# Patient Record
Sex: Male | Born: 1980 | Race: Black or African American | Hispanic: No | Marital: Married | Smoking: Never smoker
Health system: Southern US, Community
[De-identification: ages and names within clinical notes are randomized; demographics above are authoritative.]

## PROBLEM LIST (undated history)

## (undated) ENCOUNTER — Emergency Department (HOSPITAL_COMMUNITY): Admission: EM | Payer: Self-pay

## (undated) DIAGNOSIS — A6920 Lyme disease, unspecified: Secondary | ICD-10-CM

## (undated) HISTORY — PX: HERNIA REPAIR: SHX51

---

## 2008-09-06 ENCOUNTER — Ambulatory Visit: Payer: Self-pay | Admitting: Diagnostic Radiology

## 2008-09-06 ENCOUNTER — Emergency Department (HOSPITAL_BASED_OUTPATIENT_CLINIC_OR_DEPARTMENT_OTHER): Admission: EM | Admit: 2008-09-06 | Discharge: 2008-09-06 | Payer: Self-pay | Admitting: Emergency Medicine

## 2009-04-30 ENCOUNTER — Ambulatory Visit: Payer: Self-pay | Admitting: Diagnostic Radiology

## 2009-04-30 ENCOUNTER — Emergency Department (HOSPITAL_BASED_OUTPATIENT_CLINIC_OR_DEPARTMENT_OTHER): Admission: EM | Admit: 2009-04-30 | Discharge: 2009-04-30 | Payer: Self-pay | Admitting: Emergency Medicine

## 2010-08-01 ENCOUNTER — Emergency Department (HOSPITAL_BASED_OUTPATIENT_CLINIC_OR_DEPARTMENT_OTHER)
Admission: EM | Admit: 2010-08-01 | Discharge: 2010-08-01 | Disposition: A | Discharge status: Home / Self Care | Payer: Self-pay | Attending: Emergency Medicine | Admitting: Emergency Medicine

## 2010-08-01 ENCOUNTER — Emergency Department (INDEPENDENT_AMBULATORY_CARE_PROVIDER_SITE_OTHER): Payer: Self-pay

## 2010-08-01 DIAGNOSIS — E860 Dehydration: Secondary | ICD-10-CM | POA: Insufficient documentation

## 2010-08-01 DIAGNOSIS — R509 Fever, unspecified: Secondary | ICD-10-CM

## 2010-08-01 DIAGNOSIS — R112 Nausea with vomiting, unspecified: Secondary | ICD-10-CM

## 2010-08-01 LAB — DIFFERENTIAL
Basophils Relative: 0 % (ref 0–1)
Lymphs Abs: 3.7 10*3/uL (ref 0.7–4.0)
Monocytes Absolute: 0.5 10*3/uL (ref 0.1–1.0)
Neutro Abs: 1.5 10*3/uL — ABNORMAL LOW (ref 1.7–7.7)

## 2010-08-01 LAB — COMPREHENSIVE METABOLIC PANEL
ALT: 130 U/L — ABNORMAL HIGH (ref 0–53)
AST: 95 U/L — ABNORMAL HIGH (ref 0–37)
Albumin: 2.8 g/dL — ABNORMAL LOW (ref 3.5–5.2)
Alkaline Phosphatase: 97 U/L (ref 39–117)
CO2: 31 mEq/L (ref 19–32)
Creatinine, Ser: 1 mg/dL (ref 0.4–1.5)
GFR calc Af Amer: >60 mL/min (ref >60)
Glucose, Bld: 109 mg/dL — ABNORMAL HIGH (ref 70–99)
Potassium: 3.9 mEq/L (ref 3.5–5.1)
Sodium: 138 mEq/L (ref 135–145)

## 2010-08-01 LAB — CBC
HCT: 34.2 % — ABNORMAL LOW (ref 39.0–52.0)
Hemoglobin: 11.7 g/dL — ABNORMAL LOW (ref 13.0–17.0)
MCH: 28.1 pg (ref 26.0–34.0)
MCV: 82 fL (ref 78.0–100.0)
Platelets: 144 10*3/uL — ABNORMAL LOW (ref 150–400)

## 2010-08-01 LAB — URINALYSIS, ROUTINE W REFLEX MICROSCOPIC
Hgb urine dipstick: NEGATIVE
Nitrite: NEGATIVE
Protein, ur: 30 mg/dL — AB
Urobilinogen, UA: 4 mg/dL — ABNORMAL HIGH (ref 0.0–1.0)

## 2010-08-01 LAB — URINE MICROSCOPIC-ADD ON

## 2010-08-01 LAB — LIPASE, BLOOD: Lipase: 44 U/L (ref 11–59)

## 2012-07-01 ENCOUNTER — Encounter (HOSPITAL_BASED_OUTPATIENT_CLINIC_OR_DEPARTMENT_OTHER): Payer: Self-pay | Admitting: *Deleted

## 2012-07-01 ENCOUNTER — Emergency Department (HOSPITAL_BASED_OUTPATIENT_CLINIC_OR_DEPARTMENT_OTHER)
Admission: EM | Admit: 2012-07-01 | Discharge: 2012-07-02 | Disposition: A | Discharge status: Home / Self Care | Payer: Self-pay | Attending: Emergency Medicine | Admitting: Emergency Medicine

## 2012-07-01 DIAGNOSIS — R531 Weakness: Secondary | ICD-10-CM

## 2012-07-01 DIAGNOSIS — Z8619 Personal history of other infectious and parasitic diseases: Secondary | ICD-10-CM | POA: Insufficient documentation

## 2012-07-01 DIAGNOSIS — R5381 Other malaise: Secondary | ICD-10-CM | POA: Insufficient documentation

## 2012-07-01 DIAGNOSIS — R112 Nausea with vomiting, unspecified: Secondary | ICD-10-CM | POA: Insufficient documentation

## 2012-07-01 DIAGNOSIS — R5383 Other fatigue: Secondary | ICD-10-CM | POA: Insufficient documentation

## 2012-07-01 HISTORY — DX: Lyme disease, unspecified: A69.20

## 2012-07-01 MED ORDER — ONDANSETRON 8 MG PO TBDP: 8.0000 mg | ORAL_TABLET | Freq: Three times a day (TID) | ORAL | Status: DC | PRN

## 2012-07-01 NOTE — ED Provider Notes (Signed)
History    Scribed for Hanley Seamen, MD, the patient was seen in room MH06/MH06. This chart was scribed by Lewanda Rife, ED scribe. Patient's care was started at  2345   CSN: [id number]  Arrival date & time 07/01/12  2001   None     Chief Complaint  Patient presents with  . Generalized Body Aches    (Consider location/radiation/quality/duration/timing/severity/associated sxs/prior treatment) HPI HPI Comments: Taylor Davidson is a 32 y.o. male who presents to the Emergency Department complaining of constant mild generalized myalgias onset this morning. Reports decreased appetite, nausea, weakness, and vomiting. Reports mild arthralgias onset 3 days. Denies fever, significant pain, chest pain, shortness of breath, and diarrhea. Denies any aggravating or alleviating factors. Denies taking any medications prior to arrival to treat symptoms. Reports symptoms are similar, but less severe than Lyme's disease dx in 2012. Hx of dx with Lyme's disease 2012 and hospitalized 2-3 weeks and abx given for 2 weeks post admission in [place].   Past Medical History  Diagnosis Date  . Lyme disease     History reviewed. No pertinent past surgical history.  History reviewed. No pertinent family history.  History  Substance Use Topics  . Smoking status: Never Smoker   . Smokeless tobacco: Not on file  . Alcohol Use: No      Review of Systems  Constitutional: Negative for fever.  Gastrointestinal: Positive for nausea and vomiting.  Musculoskeletal: Positive for myalgias (generalized ).  All other systems reviewed and are negative.   A complete 10 system review of systems was obtained and all systems are negative except as noted in the HPI and PMH.    Allergies  Review of patient's allergies indicates no known allergies.  Home Medications  No current outpatient prescriptions on file.  BP 127/84  Pulse 82  Temp(Src) 97.9 F (36.6 C) (Oral)  Resp 18  Ht 6\' 2"  (1.88 m)  Wt  198 lb (89.812 kg)  BMI 25.41 kg/m2  SpO2 99%  Physical Exam  Nursing note and vitals reviewed. Constitutional: He is oriented to person, place, and time. He appears well-developed and well-nourished. No distress.  HENT:  Head: Normocephalic and atraumatic.  Eyes: EOM are normal.  Neck: Neck supple. No tracheal deviation present.  Cardiovascular: Normal rate.   Pulmonary/Chest: Effort normal. No respiratory distress.  Musculoskeletal: Normal range of motion.  Neurological: He is alert and oriented to person, place, and time.  Skin: Skin is warm and dry.  Psychiatric: He has a normal mood and affect. His behavior is normal.    ED Course  Procedures (including critical care time)   MDM  Will order Lyme titers and referred [facility] the [facility].      I personally performed the services described in this documentation, which was scribed in my presence.  The recorded information has been reviewed and is accurate.   Carlisle Beers Jefferson Fullam, MD 07/02/12 0001
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
## 2012-07-04 LAB — B. BURGDORFI ANTIBODIES BY WB: B burgdorferi IgM Abs (IB): NEGATIVE

## 2013-01-12 ENCOUNTER — Emergency Department (HOSPITAL_BASED_OUTPATIENT_CLINIC_OR_DEPARTMENT_OTHER)
Admission: EM | Admit: 2013-01-12 | Discharge: 2013-01-13 | Disposition: A | Discharge status: Home / Self Care | Payer: Self-pay | Attending: Emergency Medicine | Admitting: Emergency Medicine

## 2013-01-12 ENCOUNTER — Encounter (HOSPITAL_BASED_OUTPATIENT_CLINIC_OR_DEPARTMENT_OTHER): Payer: Self-pay | Admitting: Emergency Medicine

## 2013-01-12 DIAGNOSIS — S61452A Open bite of left hand, initial encounter: Secondary | ICD-10-CM

## 2013-01-12 DIAGNOSIS — Y939 Activity, unspecified: Secondary | ICD-10-CM | POA: Insufficient documentation

## 2013-01-12 DIAGNOSIS — Y929 Unspecified place or not applicable: Secondary | ICD-10-CM | POA: Insufficient documentation

## 2013-01-12 DIAGNOSIS — Z23 Encounter for immunization: Secondary | ICD-10-CM | POA: Insufficient documentation

## 2013-01-12 DIAGNOSIS — W540XXA Bitten by dog, initial encounter: Secondary | ICD-10-CM | POA: Insufficient documentation

## 2013-01-12 DIAGNOSIS — Z8619 Personal history of other infectious and parasitic diseases: Secondary | ICD-10-CM | POA: Insufficient documentation

## 2013-01-12 DIAGNOSIS — S61409A Unspecified open wound of unspecified hand, initial encounter: Secondary | ICD-10-CM | POA: Insufficient documentation

## 2013-01-12 MED ORDER — AMOXICILLIN-POT CLAVULANATE 875-125 MG PO TABS
1.0000 | ORAL_TABLET | Freq: Once | ORAL | Status: AC
  Administered 2013-01-12: 1 via ORAL
  Filled 2013-01-12: qty 1

## 2013-01-12 MED ORDER — IBUPROFEN 800 MG PO TABS
ORAL_TABLET | ORAL | Status: AC
  Filled 2013-01-12: qty 1

## 2013-01-12 MED ORDER — AMOXICILLIN-POT CLAVULANATE 875-125 MG PO TABS: 1.0000 | ORAL_TABLET | Freq: Two times a day (BID) | ORAL | Status: DC

## 2013-01-12 MED ORDER — IBUPROFEN 800 MG PO TABS
800.0000 mg | ORAL_TABLET | Freq: Once | ORAL | Status: AC
  Administered 2013-01-12: 800 mg via ORAL

## 2013-01-12 MED ORDER — HYDROCODONE-ACETAMINOPHEN 5-325 MG PO TABS: 1.0000 | ORAL_TABLET | ORAL | Status: DC | PRN

## 2013-01-12 MED ORDER — TETANUS-DIPHTH-ACELL PERTUSSIS 5-2.5-18.5 LF-MCG/0.5 IM SUSP
0.5000 mL | Freq: Once | INTRAMUSCULAR | Status: AC
  Administered 2013-01-12: 0.5 mL via INTRAMUSCULAR
  Filled 2013-01-12: qty 0.5

## 2013-01-12 NOTE — ED Notes (Signed)
Pt reports was bitten by a friends dog one day ago know owner of dog and states dog is up to date with shots

## 2013-01-12 NOTE — ED Provider Notes (Signed)
Medical screening examination/treatment/procedure(s) were performed by non-physician practitioner and as supervising physician I was immediately available for consultation/collaboration.  EKG Interpretation   None        Senna Lape K Trino Higinbotham-Rasch, MD 01/12/13 2350

## 2013-01-12 NOTE — ED Provider Notes (Signed)
CSN: [id number]     Arrival date & time 01/12/13  2247 History   First MD Initiated Contact with Patient 01/12/13 2306     Chief Complaint  Patient presents with  . Animal Bite   (Consider location/radiation/quality/duration/timing/severity/associated sxs/prior Treatment) Patient is a 32 y.o. male presenting with animal bite. The history is provided by the patient. No language interpreter was used.  Animal Bite Contact animal:  Dog Pain details:    Quality:  Aching Associated symptoms: no fever   Associated symptoms comment:  Dog mite to left hand yesterday by a dog that belonged to a neighbor. He presents tonight because he reports seeing pus in one of the puncture wounds. No fever or significant swelling.   Past Medical History  Diagnosis Date  . Lyme disease    History reviewed. No pertinent past surgical history. History reviewed. No pertinent family history. History  Substance Use Topics  . Smoking status: Never Smoker   . Smokeless tobacco: Not on file  . Alcohol Use: No    Review of Systems  Constitutional: Negative for fever.  Musculoskeletal:       See HPI.  Skin:       See HPI.    Allergies  Review of patient's allergies indicates no known allergies.  Home Medications   Current Outpatient Rx  Name  Route  Sig  Dispense  Refill  . ondansetron (ZOFRAN ODT) 8 MG disintegrating tablet   Oral   Take 1 tablet (8 mg total) by mouth every 8 (eight) hours as needed.   10 tablet   0    BP 130/76  Pulse 77  Temp(Src) 97.6 F (36.4 C) (Oral)  Resp 16  Ht 6\' 2"  (1.88 m)  Wt 205 lb (92.987 kg)  BMI 26.31 kg/m2  SpO2 100% Physical Exam  Constitutional: He is oriented to person, place, and time. He appears well-developed and well-nourished. No distress.  Neurological: He is alert and oriented to person, place, and time.  Skin: Skin is warm and dry.  Puncture wound to left dorsum of hand over 2nd MC; second wound at web space between 1st and 2nd digits;  small ulceration to dorsum of 2nd digit over proximal phalanx. No swelling of hand. No redness. Unable to express any pus from either wound. FROM all joints of hand and wrist.   Psychiatric: He has a normal mood and affect.    ED Course  Procedures (including critical care time) Labs Review Labs Reviewed - No data to display Imaging Review No results found.  EKG Interpretation   None       MDM  No diagnosis found. 1. Animal bite, left hand 2. Multiple puncture wounds, left hand  No evidence infection - no redness or visualized pus. Tetanus updated, pain managed. Abx started. Refer to hand for recheck if there are signs of infection.    Arnoldo Hooker, PA-C 01/12/13 2330

## 2013-01-13 NOTE — ED Notes (Signed)
Pt will call back with more information about the dog later this evening.  Will call Licensed conveyancer.

## 2013-01-16 NOTE — ED Notes (Signed)
Received call from [facility], s/w [place].  Will need to fax Animal Bite Report to [place], [place] [employer].

## 2013-03-01 ENCOUNTER — Encounter (HOSPITAL_BASED_OUTPATIENT_CLINIC_OR_DEPARTMENT_OTHER): Payer: Self-pay | Admitting: Emergency Medicine

## 2013-03-01 ENCOUNTER — Emergency Department (HOSPITAL_BASED_OUTPATIENT_CLINIC_OR_DEPARTMENT_OTHER): Payer: Self-pay

## 2013-03-01 ENCOUNTER — Emergency Department (HOSPITAL_BASED_OUTPATIENT_CLINIC_OR_DEPARTMENT_OTHER)
Admission: EM | Admit: 2013-03-01 | Discharge: 2013-03-01 | Disposition: A | Discharge status: Home / Self Care | Payer: Self-pay | Attending: Emergency Medicine | Admitting: Emergency Medicine

## 2013-03-01 DIAGNOSIS — R51 Headache: Secondary | ICD-10-CM | POA: Insufficient documentation

## 2013-03-01 DIAGNOSIS — M791 Myalgia, unspecified site: Secondary | ICD-10-CM

## 2013-03-01 DIAGNOSIS — R5383 Other fatigue: Secondary | ICD-10-CM

## 2013-03-01 DIAGNOSIS — J3489 Other specified disorders of nose and nasal sinuses: Secondary | ICD-10-CM | POA: Insufficient documentation

## 2013-03-01 DIAGNOSIS — R519 Headache, unspecified: Secondary | ICD-10-CM

## 2013-03-01 DIAGNOSIS — IMO0001 Reserved for inherently not codable concepts without codable children: Secondary | ICD-10-CM | POA: Insufficient documentation

## 2013-03-01 DIAGNOSIS — N289 Disorder of kidney and ureter, unspecified: Secondary | ICD-10-CM | POA: Insufficient documentation

## 2013-03-01 DIAGNOSIS — R5381 Other malaise: Secondary | ICD-10-CM | POA: Insufficient documentation

## 2013-03-01 DIAGNOSIS — H5710 Ocular pain, unspecified eye: Secondary | ICD-10-CM | POA: Insufficient documentation

## 2013-03-01 LAB — BASIC METABOLIC PANEL
BUN: 17 mg/dL (ref 6–23)
CO2: 31 mEq/L (ref 19–32)
Calcium: 9.6 mg/dL (ref 8.4–10.5)
Chloride: 101 mEq/L (ref 96–112)
Creatinine, Ser: 1.4 mg/dL — ABNORMAL HIGH (ref 0.50–1.35)
GFR, EST AFRICAN AMERICAN: 76 mL/min — AB (ref >90)
GFR, EST NON AFRICAN AMERICAN: 66 mL/min — AB (ref >90)
GLUCOSE: 88 mg/dL (ref 70–99)
Potassium: 3.8 mEq/L (ref 3.7–5.3)
Sodium: 141 mEq/L (ref 137–147)

## 2013-03-01 LAB — CBC
HEMATOCRIT: 40.8 % (ref 39.0–52.0)
Hemoglobin: 14 g/dL (ref 13.0–17.0)
MCH: 29.4 pg (ref 26.0–34.0)
MCHC: 34.3 g/dL (ref 30.0–36.0)
MCV: 85.5 fL (ref 78.0–100.0)
PLATELETS: 204 10*3/uL (ref 150–400)
RBC: 4.77 MIL/uL (ref 4.22–5.81)
RDW: 13 % (ref 11.5–15.5)
WBC: 7.1 10*3/uL (ref 4.0–10.5)

## 2013-03-01 LAB — CK: Total CK: 131 U/L (ref 7–232)

## 2013-03-01 MED ORDER — ONDANSETRON HCL 4 MG/2ML IJ SOLN: 4.0000 mg | Freq: Once | INTRAMUSCULAR | Status: DC

## 2013-03-01 MED ORDER — SODIUM CHLORIDE 0.9 % IV BOLUS (SEPSIS)
1000.0000 mL | Freq: Once | INTRAVENOUS | Status: AC
  Administered 2013-03-01: 1000 mL via INTRAVENOUS

## 2013-03-01 MED ORDER — METOCLOPRAMIDE HCL 5 MG/ML IJ SOLN
10.0000 mg | Freq: Once | INTRAMUSCULAR | Status: AC
  Administered 2013-03-01: 10 mg via INTRAVENOUS
  Filled 2013-03-01: qty 2

## 2013-03-01 MED ORDER — NAPROXEN 500 MG PO TABS: 500.0000 mg | ORAL_TABLET | Freq: Two times a day (BID) | ORAL | Status: DC

## 2013-03-01 MED ORDER — KETOROLAC TROMETHAMINE 30 MG/ML IJ SOLN
30.0000 mg | Freq: Once | INTRAMUSCULAR | Status: AC
  Administered 2013-03-01: 30 mg via INTRAVENOUS
  Filled 2013-03-01: qty 1

## 2013-03-01 NOTE — Discharge Instructions (Signed)
Your testing has shown that you have a slight change in your kidney function - this must be rechecked in the next 2 weeks to make sure that it is improving.   Please call your doctor for a followup appointment within 24-48 hours. When you talk to your doctor please let them know that you were seen in the emergency department and have them acquire all of your records so that they can discuss the findings with you and formulate a treatment plan to fully care for your new and ongoing problems.   Emergency Department Resource Guide 1) Find a Doctor and Pay Out of Pocket Although you won't have to find out who is covered by your insurance plan, it is a good idea to ask around and get recommendations. You will then need to call the office and see if the doctor you have chosen will accept you as a new patient and what types of options they offer for patients who are self-pay. Some doctors offer discounts or will set up payment plans for their patients who do not have insurance, but you will need to ask so you aren't surprised when you get to your appointment.  2) Contact Your Local Health Department Not all health departments have doctors that can see patients for sick visits, but many do, so it is worth a call to see if yours does. If you don't know where your local health department is, you can check in your phone book. The CDC also has a tool to help you locate your state's health department, and many state websites also have listings of all of their local health departments.  3) Find a Walk-in Clinic If your illness is not likely to be very severe or complicated, you may want to try a walk in clinic. These are popping up all over the country in pharmacies, drugstores, and shopping centers. They're usually staffed by nurse practitioners or physician assistants that have been trained to treat common illnesses and complaints. They're usually fairly quick and inexpensive. However, if you have serious medical  issues or chronic medical problems, these are probably not your best option.  No Primary Care Doctor: - Call Health Connect at  [phone] - they can help you locate a primary care doctor that  accepts your insurance, provides certain services, etc. - Physician Referral Service- [phone]  Chronic Pain Problems: Organization         Address  Phone   Notes  [facility]  [phone] Patients need to be referred by their primary care doctor.   Medication Assistance: Organization         Address  Phone   Notes  [facility] Medication [facility] [address] [place], [place] [address] [phone] --Must be a resident of [place] [facility] -- Must have NO insurance coverage whatsoever (no Medicaid/ Medicare, etc.) -- The pt. MUST have a primary care doctor that directs their care regularly and follows them in the community   MedAssist  [phone]   [employer]  [phone]    Agencies that provide inexpensive medical care: Organization         Address  Phone   Notes  [facility]  [phone]   [facility]    [phone]   [facility] [address] [place], [place] [address] [phone]   [facility] [address], [place] [phone]  Planned Parenthood    [phone]   [facility]    [phone]   Community Health and [place] [address], [place] Phone:  [phone], Fax:  [phone] Hours of Operation:  9 am - 6 pm, M-F.  Also accepts Medicaid/Medicare and self-pay.  [facility] for [address] [address], [address], [place] Phone: [phone], Fax: [phone]. Hours of Operation:  8:30 am - 5:30 pm, M-F.  Also accepts Medicaid and self-pay.  [facility] [address], [place] Phone: [phone]   [place], [place], [place]  [phone], Ext. [phone] Mondays & Thursdays: 7-9 AM.  First 15 patients are seen on a first come, first serve basis.    [place] Providers:  Organization         Address  Phone   Notes  [facility] [address], [place] [phone] Also accepts self-pay patients.  [facility] [address], [place]  [phone]   [place], [address], [place] [phone]   [facility] [address], [place] [phone]   Lucianne Lei [address], [place]   [phone] Only accepts [place] Access [place] patients after they have their name applied to their card.   Self-Pay (no insurance) in [facility]:  Organization         Address  Phone   Notes  Sickle Cell Patients, [facility] [place] [phone]   [facility] [place] [phone]   [facility] [place]  [place], [place], [place] [phone]   [facility]/Dr. Osei-Bonsu  [address] [place] or [address], [place] [phone] Phone number for both [place] and [place] locations is the same.  [facility] [address] [place] [phone]   [facility] [address] [place] or [address] [phone]   [facility] [address] [place] [phone], phone; [phone], fax Sees patients 1st and 3rd Saturday of every month.  Must not qualify for public or private insurance (i.e. Medicaid, Medicare, [place] Health Choice, Veterans' Benefits)  Household income should be no more than 200% of the poverty level The clinic cannot treat you if you are pregnant or think you are pregnant  Sexually transmitted  diseases are not treated at the clinic.    Dental Care: Organization         Address  Phone  Notes  [facility] of [facility] [place] [phone] Accepts children up to age 25 who are enrolled in [place] or [place]; pregnant women with a Medicaid card; and children who have applied for Medicaid or [place] Health Choice, but were declined, whose parents can pay a reduced fee at time of service.  [facility] Department of [facility]  [address], [place] [phone] Accepts children up to age 96 who are enrolled in [place] or [place]; pregnant women with a Medicaid card; and children who have applied for Medicaid or [place], but were declined,  whose parents can pay a reduced fee at time of service.  [facility]  [place] [phone] Patients are seen by appointment only. Walk-ins are not accepted. [place] will see patients 2 years of age and older. Monday - Tuesday (8am-5pm) Most Wednesdays (8:30-5pm) $30 per visit, cash only  [facility] Adult [facility] Access PRO[facility]  [address], [facility] [phone] Patients are seen by appointment only. Walk-ins are not accepted. [place] will see patients 41 years of age and older. One Wednesday Evening (Monthly: Volunteer Based).  $30 per visit, cash only  [place]  [phone] for adults; Children under age 26, call Graduate Pediatric Dentistry at [phone]. Children aged 20-14, please call [phone] to request a pediatric application.  Dental services are provided in all areas of dental care including fillings, crowns and bridges, complete and partial dentures, implants, gum treatment, root canals, and extractions. Preventive care is also provided. Treatment is provided to both adults and children. Patients are selected via a  lottery and there is often a waiting list.   [facility] [address] [place]  [phone] [email or web address]   [facility] [address] [place], [place] [phone], Ext. [phone] Second and Fourth Thursday of each month, opens at 6:30 AM; Clinic ends at 9 AM.  Patients are seen on a first-come first-served basis, and a limited number are seen during each clinic.   [facility]  [address] Hillard Danker [place], [place] [phone]   Eligibility Requirements You must have lived in [place], [place], or [place] counties for at least the last three months.   You cannot be eligible for state or federal sponsored [employer], including [employer], [place], or [employer].   You generally cannot be eligible for healthcare insurance through your employer.    How to apply: Eligibility screenings are held every Tuesday and Wednesday afternoon from 1:00 pm until 4:00 pm. You do not need an appointment for the interview!  [facility] [address] [place], [place]   [place]  [place] Department  [place]  [phone]    Behavioral Health Resources in the Community: Intensive Outpatient Programs Organization         Address  Phone  Notes  [facility] [place]. [address] [place], [place] [phone]   [facility] [address] [place], [place]   ADS: Alcohol & Drug Svcs [address], [place], [place]   [place] [address]  [place], [place] or [phone]   Substance Abuse Resources Organization         Address  Phone  Notes  Alcohol and Drug Services  [phone]   [place]  [phone]   [facility] [place]   Chinita Pester  [phone]   Residential &  Outpatient Substance Abuse Program  [phone]   Psychological Services Organization         Address  Phone  Notes  [facility]  [place]  [phone]   [place] [address], [place] or [phone]    Mobile Crisis Teams Organization         Address  Phone  Notes  Therapeutic Alternatives, Mobile  Crisis Care Unit  [phone]   Assertive Psychotherapeutic Services  [address] [place], [place]   [facility] [address] [place] [phone]    Self-Help/Support Groups Organization         Address  Phone             Notes  [place]. of [place] - variety of support groups  [place] Call for more information  Narcotics Anonymous (NA), [facility] [address], [employer] [place]  2 meetings at this location   Special educational needs teacher         Address  Phone  Notes  ASAP Residential Treatment [place],    [place]  [phone]   [facility]  [address] [place] [address]056, [place], [place]   [place], [place] [phone] Admissions: 8am-3pm M-F  Incentives Substance [place] [address],    [place], [place] [phone]   [facility] [address] [place], [place], [place]   [facility] [address],  [place], [place]   [facility]ight Programs - Intensive Outpatient [address], [place], [place]   [facility] [place] ([place] [address]  [place], [place] [phone] or [phone]   [facility] (RTS) [address], [place], [place] Accepts Medicaid  [facility] [place] [address],  [place] [phone] Substance Abuse/Addiction Treatment   [facility] Organization          Address  Phone  Notes  [facility]  [phone]   Domenic Schwab, PhD [address] Arlis Porta [place], [place]   [phone] or [phone]   [place] Br[address] [place], [place] [phone]   [facility] [address] [place] [employer], [place] [phone] Insurance/Medicaid/sponsorship through [facility] and Families [address] [place]                                    [place], [place] [phone] [place] [address], [place] [phone]    Dr. Adele Schilder  [phone]   [facility] Dept. 1) [address], [place] 2) [place] 3)  [place] [address], [place] [phone]  [phone]   [place] [phone] or [phone] (After Hours)

## 2013-03-01 NOTE — ED Provider Notes (Signed)
CSN: [id number]     Arrival date & time 03/01/13  0345 History   First MD Initiated Contact with Patient 03/01/13 0357     Chief Complaint  Patient presents with  . Headache   (Consider location/radiation/quality/duration/timing/severity/associated sxs/prior Treatment) HPI Comments: 33 y/o male with hx of Lyme disease in 2012 (treated without sequele per pt), also hx of dog bite 6 weeks ago (inside dog - dachshund), he was treated for the dog bite with tetanus and abx - couldn't find the dog after he was d/c'd - no f/u - no rabies treatment.  He states this was a friend's dog - it was an older dog that he was petting when it bit him.    He has done fine for the last month until 4 days ago when he developed a headache - diffuse aching and constant - has lasted for 4 days - not worsening, not assocaited with n/f/v/c/cough or diarrhea.  No rashes, no sob, no cp, no abd pain and normal appetitie.  He has had no numbness, no weakness, no change in vision.  He has some myalgias of his lower back and legs and has trouble standing b/c of pain and generalized weakness.  He has some pain in his neck and some pain with closing his eyes.  He denies stiffness of the neck or lymphadenopathy of the neck.  Denies other PMH and dnies sick contacts.  Family member with pt states he has been his normal self other than the headache.  Patient is a 33 y.o. male presenting with headaches. The history is provided by the patient and a relative.  Headache Associated symptoms: congestion ( chronic nasal congestion and post nasal drip), eye pain, fatigue and myalgias   Associated symptoms: no abdominal pain, no cough, no diarrhea, no dizziness, no ear pain, no fever, no nausea, no neck stiffness, no numbness, no photophobia, no seizures, no sore throat and no vomiting     Past Medical History  Diagnosis Date  . Lyme disease    History reviewed. No pertinent past surgical history. No family history on file. History   Substance Use Topics  . Smoking status: Never Smoker   . Smokeless tobacco: Not on file  . Alcohol Use: No    Review of Systems  Constitutional: Positive for fatigue. Negative for fever and chills.  HENT: Positive for congestion ( chronic nasal congestion and post nasal drip). Negative for ear pain, facial swelling, rhinorrhea and sore throat.   Eyes: Positive for pain. Negative for photophobia and visual disturbance.  Respiratory: Negative for cough, chest tightness and shortness of breath.   Cardiovascular: Negative for chest pain and leg swelling.  Gastrointestinal: Negative for nausea, vomiting, abdominal pain and diarrhea.  Endocrine: Negative for polydipsia and polyuria.  Genitourinary: Negative for hematuria.  Musculoskeletal: Positive for myalgias. Negative for neck stiffness.  Skin: Negative for rash.  Neurological: Positive for headaches. Negative for dizziness, seizures, speech difficulty and numbness.  Hematological: Negative for adenopathy.  Psychiatric/Behavioral: Negative for hallucinations, behavioral problems, confusion and decreased concentration.  All other systems reviewed and are negative.    Allergies  Review of patient's allergies indicates no known allergies.  Home Medications   Current Outpatient Rx  Name  Route  Sig  Dispense  Refill  . amoxicillin-clavulanate (AUGMENTIN) 875-125 MG per tablet   Oral   Take 1 tablet by mouth every 12 (twelve) hours.   20 tablet   0   . HYDROcodone-acetaminophen (NORCO/VICODIN) 5-325 MG per tablet  Oral   Take 1 tablet by mouth every 4 (four) hours as needed.   6 tablet   0   . naproxen (NAPROSYN) 500 MG tablet   Oral   Take 1 tablet (500 mg total) by mouth 2 (two) times daily with a meal.   30 tablet   0   . ondansetron (ZOFRAN ODT) 8 MG disintegrating tablet   Oral   Take 1 tablet (8 mg total) by mouth every 8 (eight) hours as needed.   10 tablet   0    BP 120/68  Pulse 80  Temp(Src) 97.9 F  (36.6 C) (Oral)  Resp 18  Ht 6\' 2"  (1.88 m)  Wt 210 lb (95.255 kg)  BMI 26.95 kg/m2  SpO2 100% Physical Exam  Nursing note and vitals reviewed. Constitutional: He appears well-developed and well-nourished. No distress.  HENT:  Head: Normocephalic and atraumatic.  Mouth/Throat: Oropharynx is clear and moist. No oropharyngeal exudate.  Nontender sinuses, clear oropharynx, clear nasal passages  Eyes: Conjunctivae and EOM are normal. Pupils are equal, round, and reactive to light. Right eye exhibits no discharge. Left eye exhibits no discharge. No scleral icterus.  Neck: Normal range of motion. Neck supple. No JVD present. No thyromegaly present.  Very supple nontender neck with no lymphadenopathy  Cardiovascular: Normal rate, regular rhythm, normal heart sounds and intact distal pulses.  Exam reveals no gallop and no friction rub.   No murmur heard. Pulmonary/Chest: Effort normal and breath sounds normal. No respiratory distress. He has no wheezes. He has no rales.  Abdominal: Soft. Bowel sounds are normal. He exhibits no distension and no mass. There is no tenderness.  Musculoskeletal: Normal range of motion. He exhibits no edema and no tenderness.  Lymphadenopathy:    He has no cervical adenopathy.  Neurological: He is alert. Coordination normal.  Neurologic exam:  Speech clear, pupils equal round reactive to light, extraocular movements intact  Normal peripheral visual fields Cranial nerves III through XII normal including no facial droop Follows commands, moves all extremities x4, normal strength to bilateral upper and lower extremities at all major muscle groups including grip Sensation normal to light touch and pinprick Coordination intact, no limb ataxia, finger-nose-finger normal Rapid alternating movements normal No pronator drift Gait normal   Skin: Skin is warm and dry. No rash noted. No erythema.  Psychiatric: He has a normal mood and affect. His behavior is normal.     ED Course  Procedures (including critical care time) Labs Review Labs Reviewed  BASIC METABOLIC PANEL - Abnormal; Notable for the following:    Creatinine, Ser 1.40 (*)    GFR calc non Af Amer 66 (*)    GFR calc Af Amer 76 (*)    All other components within normal limits  CBC  CK   Imaging Review Ct Head Wo Contrast  03/01/2013   CLINICAL DATA:  Headaches  EXAM: CT HEAD WITHOUT CONTRAST  TECHNIQUE: Contiguous axial images were obtained from the base of the skull through the vertex without intravenous contrast.  COMPARISON:  None.  FINDINGS: There is no acute intracranial hemorrhage or infarct. No mass lesion or midline shift. Gray-white matter differentiation is well maintained. Ventricles are normal in size without evidence of hydrocephalus. CSF containing spaces are within normal limits. No extra-axial fluid collection.  The calvarium is intact.  Orbital soft tissues are within normal limits.  The paranasal sinuses and mastoid air cells are well pneumatized and free of fluid.  Scalp soft tissues are unremarkable.  IMPRESSION: Normal head CT with no acute intracranial process.   Electronically Signed   By: Rise MuBenjamin  McClintock M.D.   On: 03/01/2013 05:39    EKG Interpretation   None       MDM   1. Headache   2. Myalgia   3. Fatigue   4. Renal insufficiency    The patient's exam is very benign, mental status is normal and neurologic exam is normal. There does not appear to be any significant infections that would be causing his symptoms, his symptoms are also not consistent with a gradually progressive neurologic disorder such as rabies. He has no pain at the inoculation site, has no neurologic symptoms, no encephalopathy, no confusion and has headache without any other significant symptoms. Vital signs are normal, treated with medications, fluids, basic labs to explore other sources including CK to rule out rhabdo.  4:50 AM - pt reexamined - has ongoing headache without  improvement after fluid and meds - he has improved myalgias - check head CT as he does not have frequent ha and this has been 4 days of posterior ha without fluctuation or improvement.  0545 AM - pt is ambulating without difficulty - has clear speech, normal coordination and states that ha and myalgias are better - review of MR shows that he had neg Lyme testing in May of 2014 when he presented to ED for myalgias at that time.    0550 AM - paged ID - d/w Dr. Ninetta LightsHatcher - recommends giving RIG and vacc series and referral for brain biopsy if unable to establish if dog is still alive.  0600 - d/w pt again - now he is adamant that the dog is still alive and he is "sure" that he has seen in in the last few days.  I asked repeatedly and he staunchly believes this to be true.  If that is the case - then this is not Rabies and no indication for further tx.  Pt inofrmed of results including Cr and indication for f/u - resource list given.  Meds given in ED:  Medications  ketorolac (TORADOL) 30 MG/ML injection 30 mg (30 mg Intravenous Given 03/01/13 0422)  metoCLOPramide (REGLAN) injection 10 mg (10 mg Intravenous Given 03/01/13 0422)  sodium chloride 0.9 % bolus 1,000 mL (0 mLs Intravenous Stopped 03/01/13 0500)    New Prescriptions   NAPROXEN (NAPROSYN) 500 MG TABLET    Take 1 tablet (500 mg total) by mouth 2 (two) times daily with a meal.      Vida RollerBrian D Rabon Scholle, MD 03/01/13 [phone]

## 2013-03-01 NOTE — ED Notes (Signed)
Pt complains of headache x 3 days.  Pt also complains of generalized bodyache and nasal congestion.  Pt has hx of lyme disease.  Pt complains of kidney pain.

## 2014-03-04 DIAGNOSIS — Z8619 Personal history of other infectious and parasitic diseases: Secondary | ICD-10-CM | POA: Insufficient documentation

## 2014-03-04 DIAGNOSIS — Z791 Long term (current) use of non-steroidal anti-inflammatories (NSAID): Secondary | ICD-10-CM | POA: Insufficient documentation

## 2014-03-04 DIAGNOSIS — Z79899 Other long term (current) drug therapy: Secondary | ICD-10-CM | POA: Insufficient documentation

## 2014-03-04 DIAGNOSIS — J392 Other diseases of pharynx: Secondary | ICD-10-CM | POA: Insufficient documentation

## 2014-03-04 DIAGNOSIS — Z792 Long term (current) use of antibiotics: Secondary | ICD-10-CM | POA: Insufficient documentation

## 2014-03-05 ENCOUNTER — Encounter (HOSPITAL_COMMUNITY): Payer: Self-pay | Admitting: Emergency Medicine

## 2014-03-05 ENCOUNTER — Emergency Department (HOSPITAL_COMMUNITY)
Admission: EM | Admit: 2014-03-05 | Discharge: 2014-03-05 | Disposition: A | Discharge status: Home / Self Care | Payer: Self-pay | Attending: Emergency Medicine | Admitting: Emergency Medicine

## 2014-03-05 DIAGNOSIS — R6889 Other general symptoms and signs: Secondary | ICD-10-CM

## 2014-03-05 DIAGNOSIS — R0989 Other specified symptoms and signs involving the circulatory and respiratory systems: Secondary | ICD-10-CM

## 2014-03-05 MED ORDER — RANITIDINE HCL 150 MG PO TABS: 150.0000 mg | ORAL_TABLET | Freq: Two times a day (BID) | ORAL | Status: DC

## 2014-03-05 MED ORDER — CETIRIZINE HCL 10 MG PO TABS: 10.0000 mg | ORAL_TABLET | Freq: Every day | ORAL | Status: DC

## 2014-03-05 NOTE — ED Provider Notes (Signed)
CSN: [id number]     Arrival date & time 03/04/14  2343 History   First MD Initiated Contact with Patient 03/05/14 0018     No chief complaint on file.    (Consider location/radiation/quality/duration/timing/severity/associated sxs/prior Treatment) HPI Taylor Davidson is a 34 y.o. male who comes in for evaluation of > 3 month hx of "excessive throat clearing". He reports he has been seen multiple times by his PCP in [place] for this condition without resolution. He denies fevers, cp, sob, abd pain, n/v/d/c, sore throat, cough, rhinorrhea. Reports he tried zyrtec once and it helped. He is able to eat, drink, swallow. There are no other modifying factors.  Past Medical History  Diagnosis Date  . Lyme disease    History reviewed. No pertinent past surgical history. No family history on file. History  Substance Use Topics  . Smoking status: Never Smoker   . Smokeless tobacco: Not on file  . Alcohol Use: No    Review of Systems  Constitutional: Negative for fever.  HENT: Negative for sore throat.   Respiratory: Negative for shortness of breath.   All other systems reviewed and are negative.     Allergies  Review of patient's allergies indicates no known allergies.  Home Medications   Prior to Admission medications   Medication Sig Start Date End Date Taking? Authorizing Provider  amoxicillin-clavulanate (AUGMENTIN) 875-125 MG per tablet Take 1 tablet by mouth every 12 (twelve) hours. 01/12/13   Shari A Upstill, PA-C  cetirizine (ZYRTEC ALLERGY) 10 MG tablet Take 1 tablet (10 mg total) by mouth daily. 03/05/14   Sharlene Motts, PA-C  HYDROcodone-acetaminophen (NORCO/VICODIN) 5-325 MG per tablet Take 1 tablet by mouth every 4 (four) hours as needed. 01/12/13   Shari A Upstill, PA-C  naproxen (NAPROSYN) 500 MG tablet Take 1 tablet (500 mg total) by mouth 2 (two) times daily with a meal. 03/01/13   Vida Roller, MD  ondansetron (ZOFRAN ODT) 8 MG disintegrating tablet Take 1 tablet (8  mg total) by mouth every 8 (eight) hours as needed. 07/01/12   John L Molpus, MD  ranitidine (ZANTAC) 150 MG tablet Take 1 tablet (150 mg total) by mouth 2 (two) times daily. 03/05/14   Earle Gell Etsuko Dierolf, PA-C   BP 127/80 mmHg  Pulse 72  Temp(Src) 98.6 F (37 C) (Oral)  Resp 18  Ht  (1.905 m)  Wt 208 lb (94.348 kg)  BMI 26.00 kg/m2  SpO2 100% Physical Exam  Constitutional: He is oriented to person, place, and time. He appears well-developed and well-nourished.  HENT:  Head: Normocephalic and atraumatic.  Mouth/Throat: Oropharynx is clear and moist. No oropharyngeal exudate.  Eyes: Conjunctivae are normal. Pupils are equal, round, and reactive to light. Right eye exhibits no discharge. Left eye exhibits no discharge. No scleral icterus.  Neck: Neck supple.  Cardiovascular: Normal rate, regular rhythm and normal heart sounds.   Pulmonary/Chest: Effort normal and breath sounds normal. No respiratory distress. He has no wheezes. He has no rales.  Abdominal: Soft. There is no tenderness.  Musculoskeletal: He exhibits no tenderness.  Neurological: He is alert and oriented to person, place, and time.  Cranial Nerves II-XII grossly intact  Skin: Skin is warm and dry. No rash noted.  Psychiatric: He has a normal mood and affect.  Nursing note and vitals reviewed.   ED Course  Procedures (including critical care time) Labs Review Labs Reviewed - No data to display  Imaging Review No results found.   EKG  Interpretation None     Meds given in ED:  Medications - No data to display  Discharge Medication List as of 03/05/2014  7:38 AM     Filed Vitals:   03/05/14 0004  BP: 127/80  Pulse: 72  Temp: 98.6 F (37 C)  TempSrc: Oral  Resp: 18  Height: 6\' 3"  (1.905 m)  Weight: 208 lb (94.348 kg)  SpO2: 100%    MDM  Vitals stable - WNL -afebrile Pt resting comfortably in ED. Tolerates PO in ED. PE--Normal exam. Low concern for other acute or emergent pathology. Will treat  for potential seasonal allergy symptoms as well as reflux.  Will DC with Zyrtec, Zantac. Referral for ENT. I discussed all relevant lab findings and imaging results with pt and they verbalized understanding. Discussed f/u with PCP within 48 hrs and return precautions, pt very amenable to plan. Pt stable, in good condition and ambulates out of ED without difficulty.  Final diagnoses:  Chronic throat clearing        Sharlene MottsBenjamin W Ova Gillentine, PA-C 03/05/14 1241  Loren Raceravid Yelverton, MD 03/06/14 1349

## 2014-03-05 NOTE — ED Notes (Signed)
Pt. reports throat discomfort , mild swelling/hard to swallow for several months , denies pain / respirations unlabored . Airway intact.

## 2015-03-30 ENCOUNTER — Emergency Department (HOSPITAL_BASED_OUTPATIENT_CLINIC_OR_DEPARTMENT_OTHER): Payer: BLUE CROSS/BLUE SHIELD

## 2015-03-30 ENCOUNTER — Emergency Department (HOSPITAL_BASED_OUTPATIENT_CLINIC_OR_DEPARTMENT_OTHER)
Admission: EM | Admit: 2015-03-30 | Discharge: 2015-03-30 | Disposition: A | Discharge status: Home / Self Care | Payer: BLUE CROSS/BLUE SHIELD | Attending: Emergency Medicine | Admitting: Emergency Medicine

## 2015-03-30 ENCOUNTER — Encounter (HOSPITAL_BASED_OUTPATIENT_CLINIC_OR_DEPARTMENT_OTHER): Payer: Self-pay

## 2015-03-30 DIAGNOSIS — R1310 Dysphagia, unspecified: Secondary | ICD-10-CM | POA: Insufficient documentation

## 2015-03-30 DIAGNOSIS — Z8619 Personal history of other infectious and parasitic diseases: Secondary | ICD-10-CM | POA: Insufficient documentation

## 2015-03-30 NOTE — ED Notes (Signed)
C/o head congestion, constantly clearing throat-seen here for same in the past-did not f/u with ENT-NAD

## 2015-03-30 NOTE — ED Notes (Signed)
Pt is being seen by PA student at this time.

## 2015-03-30 NOTE — Discharge Instructions (Signed)
Your exam was reassuring in the emergency department. Your chest x-ray showed no acute abnormalities. Please follow-up with Dr. Emeline Darling, ENT for further evaluation and management of your symptoms. Return to ED for any new or worsening symptoms.  Dysphagia Swallowing problems (dysphagia) occur when solids and liquids seem to stick in your throat on the way down to your stomach, or the food takes longer to get to the stomach. Other symptoms include regurgitating food, noises coming from the throat, chest discomfort with swallowing, and a feeling of fullness or the feeling of something being stuck in your throat when swallowing. When blockage in your throat is complete, it may be associated with drooling. CAUSES  Problems with swallowing may occur because of problems with the muscles. The food cannot be propelled in the usual manner into your stomach. You may have ulcers, scar tissue, or inflammation in the tube down which food travels from your mouth to your stomach (esophagus), which blocks food from passing normally into the stomach. Causes of inflammation include:  Acid reflux from your stomach into your esophagus.  Infection.  Radiation treatment for cancer.  Medicines taken without enough fluids to wash them down into your stomach. You may have nerve problems that prevent signals from being sent to the muscles of your esophagus to contract and move your food down to your stomach. Globus pharyngeus is a relatively common problem in which there is a sense of an obstruction or difficulty in swallowing, without any physical abnormalities of the swallowing passages being found. This problem usually improves over time with reassurance and testing to rule out other causes. DIAGNOSIS Dysphagia can be diagnosed and its cause can be determined by tests in which you swallow a white substance that helps illuminate the inside of your throat (contrast medium) while X-rays are taken. Sometimes a flexible telescope  that is inserted down your throat (endoscopy) to look at your esophagus and stomach is used. TREATMENT   If the dysphagia is caused by acid reflux or infection, medicines may be used.  If the dysphagia is caused by problems with your swallowing muscles, swallowing therapy may be used to help you strengthen your swallowing muscles.  If the dysphagia is caused by a blockage or mass, procedures to remove the blockage may be done. HOME CARE INSTRUCTIONS  Try to eat soft food that is easier to swallow and check your weight on a daily basis to be sure that it is not decreasing.  Be sure to drink liquids when sitting upright (not lying down). SEEK MEDICAL CARE IF:  You are losing weight because you are unable to swallow.  You are coughing when you drink liquids (aspiration).  You are coughing up partially digested food. SEEK IMMEDIATE MEDICAL CARE IF:  You are unable to swallow your own saliva .  You are having shortness of breath or a fever, or both.  You have a hoarse voice along with difficulty swallowing. MAKE SURE YOU:  Understand these instructions.  Will watch your condition.  Will get help right away if you are not doing well or get worse.   This information is not intended to replace advice given to you by your health care provider. Make sure you discuss any questions you have with your health care provider.   Document Released: 02/05/2000 Document Revised: 02/28/2014 Document Reviewed: 07/27/2012 Elsevier Interactive Patient Education 2016 [employer].  Barium Swallow A barium swallow is an X-ray exam that is used to evaluate the area at the back of the  throat (pharynx) and the tube that carries food and liquid from the mouth to the stomach (esophagus). For this exam, you will swallow a white chalky liquid called barium. X-rays are done while the barium passes through the areas that are being checked. The barium shows up well on X-rays, making it easier for your health  care provider to see possible problems. A barium swallow may be done to check for various problems, such as:  Ulcers.  Tumors.  Inflammation of the esophagus.  Hiatal hernia. This is a condition in which the upper part of the stomach slides into the lower chest.  Scarring.  Blockages.  Problems with the muscular wall of the pharynx and esophagus. Your health care provider may recommend this procedure to help make a diagnosis if you have any of these symptoms:  Difficulty swallowing.  Chest pain that is not related to the heart.  Gastroesophageal reflux, which is a backward flow of stomach contents into the esophagus.  Unexplained vomiting.  Severe indigestion. LET [facility] CARE PROVIDER KNOW ABOUT:  Any allergies you have, especially allergies to contrast materials.  All medicines you are taking, including vitamins, herbs, eye drops, creams, and over-the-counter medicines.  Any blood disorders you have.  Previous surgeries you have had.  Any medical conditions you may have.  If you are pregnant or you think that you may be pregnant. RISKS AND COMPLICATIONS Generally, this is a safe procedure. However, problems may occur, including:  Constipation.  Fecal impaction.  Exposure to radiation (a small amount).  Allergic reaction to the barium. This is rare. BEFORE THE PROCEDURE  Follow your health care provider's instructions about eating or drinking restrictions.  Ask your health care provider about changing or stopping your regular medicines. This is especially important if you are taking diabetes medicines or blood thinners. PROCEDURE  You will be positioned on an X-ray table.  You will be asked to drink the liquid barium, which looks like a light-colored milkshake. You will probably drink the barium through a straw.  During the procedure, the X-ray table may be moved to a more upright angle. You may also be asked to shift your position on the table. This  will allow your entire esophagus to be viewed.  The health care provider will watch the barium flow through your esophagus using a type of X-ray that allows images to be viewed on a monitor in a movie-like sequence (fluoroscopy). X-ray images will also be stored for later viewing. The procedure may vary among health care providers and hospitals. AFTER THE PROCEDURE  Return to your normal activities and your normal diet as directed by your health care provider.  Your stool (feces) may be white or gray for 2-3 days until all of the barium has passed out of your body in your stool. You may be given a laxative to take to help remove the barium from your body.  Your health care provider may recommend other things to help prevent constipation after this procedure, including:  Drinking enough fluid to keep your urine clear or pale yellow.  Eating foods that are high in fiber, such as fruits, vegetables, whole grains, and beans.  Call your health care provider if:  You have difficulty having bowel movements, or you are not able to have a bowel movement or to pass gas.  You have belly (abdominal) pain or swelling of your abdomen.  You have a fever.  It is your responsibility to obtain your test results. Ask your health  care provider or the department performing the test when and how you will get your results.   This information is not intended to replace advice given to you by your health care provider. Make sure you discuss any questions you have with your health care provider.   Document Released: 06/21/2006 Document Revised: 02/28/2014 Document Reviewed: 11/19/2013 Elsevier Interactive Patient Education [employer]

## 2015-03-30 NOTE — ED Provider Notes (Signed)
CSN: [id number]     Arrival date & time 03/30/15  1744 History   First MD Initiated Contact with Patient 03/30/15 1853     Chief Complaint  Patient presents with  . URI     (Consider location/radiation/quality/duration/timing/severity/associated sxs/prior Treatment) HPI Taylor Davidson is a 35 y.o. male who comes in for evaluation of difficulty swallowing. Patient reports he has had difficulty swallowing constantly over the past 3 or 4 years. He reports feeling a need to constantly clear his throat. Reports symptoms are improved when he is eating and drinking. 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 He denies any fevers, chills, night sweats, recent travel, shortness of breath, cough, leg swelling. He reports being evaluated for this multiple times in the past, but has not followed up with a specialist. Nothing makes the problem better or worse. No other modifying factors.  Past Medical History  Diagnosis Date  . Lyme disease    History reviewed. No pertinent past surgical history. No family history on file. Social History  Substance Use Topics  . Smoking status: Never Smoker   . Smokeless tobacco: None  . Alcohol Use: No    Review of Systems A 10 point review of systems was completed and was negative except for pertinent positives and negatives as mentioned in the history of present illness     Allergies  Review of patient's allergies indicates no known allergies.  Home Medications   Prior to Admission medications   Not on File   BP 133/68 mmHg  Pulse 83  Temp(Src) 98 F (36.7 C) (Oral)  Resp 18  Ht  (1.88 m)  Wt 94.348 kg  BMI 26.69 kg/m2  SpO2 98% Physical Exam  Constitutional: He is oriented to person, place, and time. He appears well-developed and well-nourished.  HENT:  Head: Normocephalic and atraumatic.  Mouth/Throat: Oropharynx is clear and moist. No oropharyngeal exudate.   Eyes: Conjunctivae and EOM are normal. Pupils are equal, round, and reactive to light. Right eye exhibits no discharge. Left eye exhibits no discharge. No scleral icterus.  Neck: Normal range of motion. Neck supple.  Cardiovascular: Normal rate, regular rhythm and normal heart sounds.   Pulmonary/Chest: Effort normal and breath sounds normal. No respiratory distress. He has no wheezes. He has no rales.  Abdominal: Soft. There is no tenderness.  Musculoskeletal: He exhibits no tenderness.  Neurological: He is alert and oriented to person, place, and time.  Cranial Nerves II-XII grossly intact. Motor strength baseline. Sensation intact to light touch. Gait is baseline. Moves all extremities without ataxia  Skin: Skin is warm and dry. No rash noted. He is not diaphoretic.  Psychiatric: He has a normal mood and affect.  Nursing note and vitals reviewed.   ED Course  Procedures (including critical care time) Labs Review Labs Reviewed - No data to display  Imaging Review Dg Chest 2 View  03/30/2015  CLINICAL DATA:  Hemoptysis EXAM: CHEST  2 VIEW COMPARISON:  04/30/2009 FINDINGS: The heart size and mediastinal contours are within normal limits. Both lungs are clear. The visualized skeletal structures are unremarkable. IMPRESSION: No active cardiopulmonary disease. Electronically Signed   By: Charlett Nose M.D.   On: 03/30/2015 19:36   I have personally reviewed and evaluated these images and lab results as part of my medical decision-making.   EKG Interpretation None     Meds given in ED:  Medications - No data to display  There are no discharge medications for this patient.  Filed Vitals:   03/30/15 1752 03/30/15 1944  BP: 130/90 133/68  Pulse: 86 83  Temp: 98.3 F (36.8 C) 98 F (36.7 C)  TempSrc: Oral Oral  Resp: 18 18  Height:  (1.88 m)   Weight: 94.348 kg   SpO2: 98% 98%    MDM  Taylor Davidson is a 35 y.o. male comes in for evaluation of intermittent dysphagia.  Symptoms have been ongoing for past 3 or 4 years. On arrival, patient is hemodynamically stable and afebrile. On exam, Oropharynx is clear and moist with patent airway. Nonfocal neuro exam. Low suspicion for emergent central lesion. No overt hemoptysis, presentation not consistent with PE. Patient appears very well. Chest x-ray without any acute abnormality. Patient given referral to ENT and instructions to follow-up within the next 2-3 days. He verbalizes understanding and agrees with this plan. No evidence of other acute emergent pathology at this time. Final diagnoses:  Dysphagia        Joycie Peek, PA-C 03/31/15 1459  Nelva Nay, MD 04/01/15 1515

## 2015-04-24 ENCOUNTER — Other Ambulatory Visit (HOSPITAL_COMMUNITY): Payer: Self-pay | Admitting: Otolaryngology

## 2015-04-24 DIAGNOSIS — R0989 Other specified symptoms and signs involving the circulatory and respiratory systems: Secondary | ICD-10-CM

## 2015-04-24 DIAGNOSIS — R1310 Dysphagia, unspecified: Secondary | ICD-10-CM

## 2015-05-01 ENCOUNTER — Ambulatory Visit (HOSPITAL_COMMUNITY)
Admission: RE | Admit: 2015-05-01 | Discharge: 2015-05-01 | Disposition: A | Discharge status: Home / Self Care | Payer: BLUE CROSS/BLUE SHIELD | Source: Ambulatory Visit | Attending: Otolaryngology | Admitting: Otolaryngology

## 2015-05-01 DIAGNOSIS — R1310 Dysphagia, unspecified: Secondary | ICD-10-CM | POA: Diagnosis not present

## 2015-05-01 DIAGNOSIS — R0989 Other specified symptoms and signs involving the circulatory and respiratory systems: Secondary | ICD-10-CM

## 2015-05-01 DIAGNOSIS — F458 Other somatoform disorders: Secondary | ICD-10-CM | POA: Insufficient documentation

## 2015-05-09 ENCOUNTER — Emergency Department (HOSPITAL_BASED_OUTPATIENT_CLINIC_OR_DEPARTMENT_OTHER)
Admission: EM | Admit: 2015-05-09 | Discharge: 2015-05-09 | Disposition: A | Discharge status: Home / Self Care | Payer: BLUE CROSS/BLUE SHIELD | Attending: Emergency Medicine | Admitting: Emergency Medicine

## 2015-05-09 ENCOUNTER — Encounter (HOSPITAL_BASED_OUTPATIENT_CLINIC_OR_DEPARTMENT_OTHER): Payer: Self-pay | Admitting: *Deleted

## 2015-05-09 ENCOUNTER — Emergency Department (HOSPITAL_BASED_OUTPATIENT_CLINIC_OR_DEPARTMENT_OTHER): Payer: BLUE CROSS/BLUE SHIELD

## 2015-05-09 DIAGNOSIS — Z8619 Personal history of other infectious and parasitic diseases: Secondary | ICD-10-CM | POA: Insufficient documentation

## 2015-05-09 DIAGNOSIS — R51 Headache: Secondary | ICD-10-CM | POA: Insufficient documentation

## 2015-05-09 DIAGNOSIS — R0602 Shortness of breath: Secondary | ICD-10-CM | POA: Insufficient documentation

## 2015-05-09 DIAGNOSIS — M791 Myalgia: Secondary | ICD-10-CM | POA: Diagnosis not present

## 2015-05-09 DIAGNOSIS — R05 Cough: Secondary | ICD-10-CM | POA: Diagnosis not present

## 2015-05-09 DIAGNOSIS — R6889 Other general symptoms and signs: Secondary | ICD-10-CM

## 2015-05-09 DIAGNOSIS — R0981 Nasal congestion: Secondary | ICD-10-CM | POA: Insufficient documentation

## 2015-05-09 DIAGNOSIS — H5710 Ocular pain, unspecified eye: Secondary | ICD-10-CM | POA: Diagnosis not present

## 2015-05-09 DIAGNOSIS — R509 Fever, unspecified: Secondary | ICD-10-CM | POA: Diagnosis not present

## 2015-05-09 MED ORDER — ACETAMINOPHEN 325 MG PO TABS
650.0000 mg | ORAL_TABLET | Freq: Once | ORAL | Status: AC
  Administered 2015-05-09: 650 mg via ORAL
  Filled 2015-05-09: qty 2

## 2015-05-09 MED ORDER — IBUPROFEN 800 MG PO TABS
800.0000 mg | ORAL_TABLET | Freq: Once | ORAL | Status: AC
Start: 2015-05-09 — End: 2015-05-09
  Administered 2015-05-09: 800 mg via ORAL
  Filled 2015-05-09: qty 1

## 2015-05-09 MED ORDER — PROMETHAZINE-DM 6.25-15 MG/5ML PO SYRP: 5.0000 mL | ORAL_SOLUTION | Freq: Four times a day (QID) | ORAL | Status: DC | PRN

## 2015-05-09 MED ORDER — GUAIFENESIN 100 MG/5ML PO LIQD: 200.0000 mg | ORAL | Status: DC | PRN

## 2015-05-09 NOTE — ED Notes (Signed)
Pt reports fever of 100.9 yesterday.  States that he has been coughing, sinus pressure behind eyes x several days.

## 2015-05-09 NOTE — Discharge Instructions (Signed)
Viral Infections °A viral infection can be caused by different types of viruses. Most viral infections are not serious and resolve on their own. However, some infections may cause severe symptoms and may lead to further complications. °SYMPTOMS °Viruses can frequently cause: °· Minor sore throat. °· Aches and pains. °· Headaches. °· Runny nose. °· Different types of rashes. °· Watery eyes. °· Tiredness. °· Cough. °· Loss of appetite. °· Gastrointestinal infections, resulting in nausea, vomiting, and diarrhea. °These symptoms do not respond to antibiotics because the infection is not caused by bacteria. However, you might catch a bacterial infection following the viral infection. This is sometimes called a "superinfection." Symptoms of such a bacterial infection may include: °· Worsening sore throat with pus and difficulty swallowing. °· Swollen neck glands. °· Chills and a high or persistent fever. °· Severe headache. °· Tenderness over the sinuses. °· Persistent overall ill feeling (malaise), muscle aches, and tiredness (fatigue). °· Persistent cough. °· Yellow, green, or brown mucus production with coughing. °HOME CARE INSTRUCTIONS  °· Only take over-the-counter or prescription medicines for pain, discomfort, diarrhea, or fever as directed by your caregiver. °· Drink enough water and fluids to keep your urine clear or pale yellow. Sports drinks can provide valuable electrolytes, sugars, and hydration. °· Get plenty of rest and maintain proper nutrition. Soups and broths with crackers or rice are fine. °SEEK IMMEDIATE MEDICAL CARE IF:  °· You have severe headaches, shortness of breath, chest pain, neck pain, or an unusual rash. °· You have uncontrolled vomiting, diarrhea, or you are unable to keep down fluids. °· You or your child has an oral temperature above 102° F (38.9° C), not controlled by medicine. °· Your baby is older than 3 months with a rectal temperature of 102° F (38.9° C) or higher. °· Your baby is 3  months old or younger with a rectal temperature of 100.4° F (38° C) or higher. °MAKE SURE YOU:  °· Understand these instructions. °· Will watch your condition. °· Will get help right away if you are not doing well or get worse. °  °This information is not intended to replace advice given to you by your health care provider. Make sure you discuss any questions you have with your health care provider. °  °Document Released: 11/17/2004 Document Revised: 05/02/2011 Document Reviewed: 07/16/2014 °Elsevier Interactive Patient Education ©2016 [employer] ° °

## 2015-05-09 NOTE — ED Provider Notes (Signed)
CSN: [id number]     Arrival date & time 05/09/15  1453 History   First MD Initiated Contact with Patient 05/09/15 1704     Chief Complaint  Patient presents with  . URI     (Consider location/radiation/quality/duration/timing/severity/associated sxs/prior Treatment) HPI  35 year old male who presents with complaints of cold symptoms.  Pt report productive cough with yellow mucous x 3 days. Yesterday pt developed fever, chills, myalgias.  Sts fever tmax 100.9, improves with ibuprofen.  Pt now report headache, nasal congestion, eye pain. Report mild SOB without wheezing.  Has normal appetite.  Denies n/v/d.  No chest pain, abdominal pain, dysuria, bowel bladder problem Report recent sick contact.    Past Medical History  Diagnosis Date  . Lyme disease    History reviewed. No pertinent past surgical history. History reviewed. No pertinent family history. Social History  Substance Use Topics  . Smoking status: Never Smoker   . Smokeless tobacco: None  . Alcohol Use: No    Review of Systems  All other systems reviewed and are negative.     Allergies  Review of patient's allergies indicates no known allergies.  Home Medications   Prior to Admission medications   Not on File   BP 120/74 mmHg  Pulse 90  Temp(Src) 101.2 F (38.4 C) (Oral)  Resp 20  Ht 6\' 2"  (1.88 m)  Wt 97.523 kg  BMI 27.59 kg/m2  SpO2 100% Physical Exam  Constitutional: He is oriented to person, place, and time. He appears well-developed and well-nourished. No distress.  HENT:  Head: Atraumatic.  Right Ear: External ear normal.  Left Ear: External ear normal.  Nose: Nose normal.  Mouth/Throat: Oropharynx is clear and moist. No oropharyngeal exudate.  Eyes: Conjunctivae are normal.  Neck: Normal range of motion. Neck supple.  No nuchal rigidity  Cardiovascular: Normal rate and regular rhythm.   Pulmonary/Chest: Effort normal and breath sounds normal. No respiratory distress. He has no wheezes.  He has no rales. He exhibits no tenderness.  Abdominal: Soft. There is no tenderness.  Neurological: He is alert and oriented to person, place, and time.  Skin: No rash noted.  Psychiatric: He has a normal mood and affect.  Nursing note and vitals reviewed.   ED Course  Procedures (including critical care time) Labs Review Labs Reviewed - No data to display  Imaging Review Dg Chest 2 View  05/09/2015  CLINICAL DATA:  Cough.  Fever. EXAM: CHEST  2 VIEW COMPARISON:  03/30/2015 chest radiograph. FINDINGS: Stable cardiomediastinal silhouette with normal heart size. No pneumothorax. No pleural effusion. Lungs appear clear, with no acute consolidative airspace disease and no pulmonary edema. IMPRESSION: No active cardiopulmonary disease. Electronically Signed   By: Delbert PhenixJason A Poff M.D.   On: 05/09/2015 17:29   I have personally reviewed and evaluated these images and lab results as part of my medical decision-making.   EKG Interpretation None      MDM   Final diagnoses:  Flu-like symptoms    BP 120/74 mmHg  Pulse 90  Temp(Src) 100.5 F (38.1 C) (Oral)  Resp 20  Ht 6\' 2"  (1.88 m)  Wt 97.523 kg  BMI 27.59 kg/m2  SpO2 100%   6:13 PM Patient here with flulike symptoms. Does have a low-grade fever improved with taking Tylenol. Chest x-ray shows no signs of pneumonia. No signs of meningitis or other acute emergent medical condition. Stable for discharge with symptomatic treatment. Return precaution discussed.  Fayrene HelperBowie Rylin Seavey, PA-C 05/09/15 1814  Marily MemosJason Mesner, MD  05/13/15 1550 

## 2015-05-09 NOTE — ED Notes (Addendum)
PA at bedside to answer pts questions prior to Dc.

## 2016-01-16 ENCOUNTER — Encounter (HOSPITAL_BASED_OUTPATIENT_CLINIC_OR_DEPARTMENT_OTHER): Payer: Self-pay | Admitting: Emergency Medicine

## 2016-01-16 ENCOUNTER — Emergency Department (HOSPITAL_BASED_OUTPATIENT_CLINIC_OR_DEPARTMENT_OTHER)
Admission: EM | Admit: 2016-01-16 | Discharge: 2016-01-16 | Disposition: A | Discharge status: Home / Self Care | Payer: BLUE CROSS/BLUE SHIELD | Attending: Emergency Medicine | Admitting: Emergency Medicine

## 2016-01-16 DIAGNOSIS — R112 Nausea with vomiting, unspecified: Secondary | ICD-10-CM | POA: Insufficient documentation

## 2016-01-16 DIAGNOSIS — R1013 Epigastric pain: Secondary | ICD-10-CM | POA: Insufficient documentation

## 2016-01-16 DIAGNOSIS — R509 Fever, unspecified: Secondary | ICD-10-CM | POA: Insufficient documentation

## 2016-01-16 LAB — COMPREHENSIVE METABOLIC PANEL
ALBUMIN: 4.3 g/dL (ref 3.5–5.0)
ALT: 19 U/L (ref 17–63)
ANION GAP: 9 (ref 5–15)
AST: 20 U/L (ref 15–41)
Alkaline Phosphatase: 53 U/L (ref 38–126)
BILIRUBIN TOTAL: 0.8 mg/dL (ref 0.3–1.2)
BUN: 19 mg/dL (ref 6–20)
CHLORIDE: 102 mmol/L (ref 101–111)
CO2: 26 mmol/L (ref 22–32)
Calcium: 9.5 mg/dL (ref 8.9–10.3)
Creatinine, Ser: 1.5 mg/dL — ABNORMAL HIGH (ref 0.61–1.24)
GFR calc Af Amer: >60 mL/min (ref >60)
GFR calc non Af Amer: 59 mL/min — ABNORMAL LOW (ref >60)
GLUCOSE: 107 mg/dL — AB (ref 65–99)
POTASSIUM: 3.8 mmol/L (ref 3.5–5.1)
SODIUM: 137 mmol/L (ref 135–145)
TOTAL PROTEIN: 8.2 g/dL — AB (ref 6.5–8.1)

## 2016-01-16 LAB — CBC WITH DIFFERENTIAL/PLATELET
BASOS ABS: 0 10*3/uL (ref 0.0–0.1)
BASOS PCT: 0 %
EOS ABS: 0 10*3/uL (ref 0.0–0.7)
EOS PCT: 1 %
HEMATOCRIT: 41.5 % (ref 39.0–52.0)
Hemoglobin: 14.5 g/dL (ref 13.0–17.0)
Lymphocytes Relative: 14 %
Lymphs Abs: 0.9 10*3/uL (ref 0.7–4.0)
MCH: 29.2 pg (ref 26.0–34.0)
MCHC: 34.9 g/dL (ref 30.0–36.0)
MCV: 83.5 fL (ref 78.0–100.0)
MONO ABS: 0.6 10*3/uL (ref 0.1–1.0)
MONOS PCT: 9 %
Neutro Abs: 5.1 10*3/uL (ref 1.7–7.7)
Neutrophils Relative %: 76 %
PLATELETS: 195 10*3/uL (ref 150–400)
RBC: 4.97 MIL/uL (ref 4.22–5.81)
RDW: 12.8 % (ref 11.5–15.5)
WBC: 6.6 10*3/uL (ref 4.0–10.5)

## 2016-01-16 LAB — LIPASE, BLOOD: LIPASE: 23 U/L (ref 11–51)

## 2016-01-16 MED ORDER — GI COCKTAIL ~~LOC~~
30.0000 mL | Freq: Once | ORAL | Status: AC
  Administered 2016-01-16: 30 mL via ORAL
  Filled 2016-01-16: qty 30

## 2016-01-16 MED ORDER — DIPHENHYDRAMINE HCL 50 MG/ML IJ SOLN
25.0000 mg | Freq: Once | INTRAMUSCULAR | Status: AC
  Administered 2016-01-16: 25 mg via INTRAVENOUS
  Filled 2016-01-16: qty 1

## 2016-01-16 MED ORDER — ONDANSETRON HCL 4 MG/2ML IJ SOLN
4.0000 mg | Freq: Once | INTRAMUSCULAR | Status: AC
  Administered 2016-01-16: 4 mg via INTRAVENOUS
  Filled 2016-01-16: qty 2

## 2016-01-16 MED ORDER — SODIUM CHLORIDE 0.9 % IV BOLUS (SEPSIS)
1000.0000 mL | Freq: Once | INTRAVENOUS | Status: AC
  Administered 2016-01-16: 1000 mL via INTRAVENOUS

## 2016-01-16 MED ORDER — ONDANSETRON 4 MG PO TBDP: ORAL_TABLET | ORAL | 0 refills | Status: DC

## 2016-01-16 MED ORDER — PROCHLORPERAZINE EDISYLATE 5 MG/ML IJ SOLN
10.0000 mg | Freq: Once | INTRAMUSCULAR | Status: AC
  Administered 2016-01-16: 10 mg via INTRAVENOUS
  Filled 2016-01-16: qty 2

## 2016-01-16 NOTE — ED Notes (Signed)
ED Provider at bedside. 

## 2016-01-16 NOTE — ED Provider Notes (Signed)
[facility] Note   CSN: [id number] Arrival date & time: 01/16/16  0147     History   Chief Complaint Chief Complaint  Patient presents with  . Emesis    HPI Taylor HarpsKhalif Kolodny is a 35 y.o. male.  35 yo M with a chief complaint of fever nausea and vomiting. Patient did not feel well for the past few days and started throwing up today. After throwing up for a few hours he started having some diffuse crampy abdominal pain. Denies diarrhea. Denies sick contacts. Denies suspicious food intake. Patient is unable to tolerate anything at home now is feeling lightheaded upon standing.   The history is provided by the patient and the spouse.  Emesis   This is a new problem. The current episode started 12 to 24 hours ago. The problem occurs continuously. The problem has not changed since onset.The emesis has an appearance of stomach contents. The maximum temperature recorded prior to his arrival was 101 to 101.9 F. The fever has been present for less than 1 day. Associated symptoms include abdominal pain and a fever. Pertinent negatives include no arthralgias, no chills, no diarrhea, no headaches and no myalgias.    Past Medical History:  Diagnosis Date  . Lyme disease     There are no active problems to display for this patient.   History reviewed. No pertinent surgical history.     Home Medications    Prior to Admission medications   Medication Sig Start Date End Date Taking? Authorizing Provider  guaiFENesin (ROBITUSSIN) 100 MG/5ML liquid Take 10 mLs (200 mg total) by mouth every 4 (four) hours as needed for congestion. 05/09/15   Fayrene HelperBowie Tran, PA-C  ondansetron (ZOFRAN ODT) 4 MG disintegrating tablet 4mg  ODT q4 hours prn nausea/vomit 01/16/16   Melene Planan Graycen Degan, DO  promethazine-dextromethorphan (PROMETHAZINE-DM) 6.25-15 MG/5ML syrup Take 5 mLs by mouth 4 (four) times daily as needed. 05/09/15   Fayrene HelperBowie Tran, PA-C    Family History No family history on file.  Social  History Social History  Substance Use Topics  . Smoking status: Never Smoker  . Smokeless tobacco: Never Used  . Alcohol use No     Allergies   Patient has no known allergies.   Review of Systems Review of Systems  Constitutional: Positive for fever. Negative for chills.  HENT: Negative for congestion and facial swelling.   Eyes: Negative for discharge and visual disturbance.  Respiratory: Negative for shortness of breath.   Cardiovascular: Negative for chest pain and palpitations.  Gastrointestinal: Positive for abdominal pain, nausea and vomiting. Negative for diarrhea.  Musculoskeletal: Negative for arthralgias and myalgias.  Skin: Negative for color change and rash.  Neurological: Negative for tremors, syncope and headaches.  Psychiatric/Behavioral: Negative for confusion and dysphoric mood.     Physical Exam Updated Vital Signs BP 102/67 (BP Location: Left Arm)   Pulse 96   Temp 98.1 F (36.7 C) (Oral)   Resp 26   Ht 6\' 2"  (1.88 m)   Wt 185 lb (83.9 kg)   SpO2 100%   BMI 23.75 kg/m   Physical Exam  Constitutional: He is oriented to person, place, and time. He appears well-developed and well-nourished.  HENT:  Head: Normocephalic and atraumatic.  Eyes: EOM are normal. Pupils are equal, round, and reactive to light.  Neck: Normal range of motion. Neck supple. No JVD present.  Cardiovascular: Normal rate and regular rhythm.  Exam reveals no gallop and no friction rub.   No murmur heard.  Pulmonary/Chest: No respiratory distress. He has no wheezes.  Abdominal: He exhibits no distension and no mass. There is tenderness (mild epigastric, negative murphys). There is no rebound and no guarding.  Musculoskeletal: Normal range of motion.  Neurological: He is alert and oriented to person, place, and time.  Skin: No rash noted. No pallor.  Psychiatric: He has a normal mood and affect. His behavior is normal.  Nursing note and vitals reviewed.    ED Treatments /  Results  Labs (all labs ordered are listed, but only abnormal results are displayed) Labs Reviewed  COMPREHENSIVE METABOLIC PANEL - Abnormal; Notable for the following:       Result Value   Glucose, Bld 107 (*)    Creatinine, Ser 1.50 (*)    Total Protein 8.2 (*)    GFR calc non Af Amer 59 (*)    All other components within normal limits  CBC WITH DIFFERENTIAL/PLATELET  LIPASE, BLOOD    EKG  EKG Interpretation None       Radiology No results found.  Procedures Procedures (including critical care time)  Medications Ordered in ED Medications  ondansetron (ZOFRAN) injection 4 mg (4 mg Intravenous Given 01/16/16 0213)  sodium chloride 0.9 % bolus 1,000 mL (0 mLs Intravenous Stopped 01/16/16 0253)  gi cocktail (Maalox,Lidocaine,Donnatal) (30 mLs Oral Given 01/16/16 0253)  sodium chloride 0.9 % bolus 1,000 mL (1,000 mLs Intravenous New Bag/Given 01/16/16 0336)  prochlorperazine (COMPAZINE) injection 10 mg (10 mg Intravenous Given 01/16/16 0336)  diphenhydrAMINE (BENADRYL) injection 25 mg (25 mg Intravenous Given 01/16/16 [id number])     Initial Impression / Assessment and Plan / ED Course  I have reviewed the triage vital signs and the nursing notes.  Pertinent labs & imaging results that were available during my care of the patient were reviewed by me and considered in my medical decision making (see chart for details).  Clinical Course     35 yo M With a chief complaint of fever nausea and vomiting. Patient started having abdominal pain after throwing up. He has no pain in the right upper and right lower quadrant. I do not feel that he needs imaging of the abdomen at this time. Labs reassuring. Patient was given fluids with some improvement of his symptoms. Able to tolerate by mouth in the ED.  4:12 AM:  I have discussed the diagnosis/risks/treatment options with the patient and family and believe the pt to be eligible for discharge home to follow-up with PCP. We also discussed  returning to the ED immediately if new or worsening sx occur. We discussed the sx which are most concerning (e.g., sudden worsening pain, fever, inability to tolerate by mouth) that necessitate immediate return. Medications administered to the patient during their visit and any new prescriptions provided to the patient are listed below.  Medications given during this visit Medications  ondansetron (ZOFRAN) injection 4 mg (4 mg Intravenous Given 01/16/16 0213)  sodium chloride 0.9 % bolus 1,000 mL (0 mLs Intravenous Stopped 01/16/16 0253)  gi cocktail (Maalox,Lidocaine,Donnatal) (30 mLs Oral Given 01/16/16 0253)  sodium chloride 0.9 % bolus 1,000 mL (1,000 mLs Intravenous New Bag/Given 01/16/16 0336)  prochlorperazine (COMPAZINE) injection 10 mg (10 mg Intravenous Given 01/16/16 0336)  diphenhydrAMINE (BENADRYL) injection 25 mg (25 mg Intravenous Given 01/16/16 [id number])     The patient appears reasonably screen and/or stabilized for discharge and I doubt any other medical condition or other [facility] requiring further screening, evaluation, or treatment in the ED at this time prior to  discharge.    Final Clinical Impressions(s) / ED Diagnoses   Final diagnoses:  Nausea and vomiting, intractability of vomiting not specified, unspecified vomiting type    New Prescriptions New Prescriptions   ONDANSETRON (ZOFRAN ODT) 4 MG DISINTEGRATING TABLET    4mg  ODT q4 hours prn nausea/vomit     Melene Plan, DO 01/16/16 [phone]

## 2016-01-16 NOTE — ED Triage Notes (Addendum)
Pt c/o n/v x 3- episodes, abd pain and tingling in extremities. Pt also states he took a stool softener today. Last BM yesterday. Pt hyperventilating in triage. Pt encouraged to slow breathing.

## 2016-02-09 ENCOUNTER — Ambulatory Visit (HOSPITAL_COMMUNITY)
Admission: EM | Admit: 2016-02-09 | Discharge: 2016-02-09 | Disposition: A | Discharge status: Home / Self Care | Payer: Self-pay | Attending: Family Medicine | Admitting: Family Medicine

## 2016-02-09 ENCOUNTER — Encounter (HOSPITAL_COMMUNITY): Payer: Self-pay | Admitting: Emergency Medicine

## 2016-02-09 DIAGNOSIS — R509 Fever, unspecified: Secondary | ICD-10-CM

## 2016-02-09 DIAGNOSIS — B9789 Other viral agents as the cause of diseases classified elsewhere: Secondary | ICD-10-CM

## 2016-02-09 DIAGNOSIS — J069 Acute upper respiratory infection, unspecified: Secondary | ICD-10-CM

## 2016-02-09 MED ORDER — ACETAMINOPHEN 325 MG PO TABS
650.0000 mg | ORAL_TABLET | Freq: Once | ORAL | Status: AC
  Administered 2016-02-09: 650 mg via ORAL

## 2016-02-09 MED ORDER — ACETAMINOPHEN 325 MG PO TABS
ORAL_TABLET | ORAL | Status: AC
  Filled 2016-02-09: qty 2

## 2016-02-09 NOTE — ED Triage Notes (Signed)
[facility] a [facility] fever and general body aches that started today.

## 2016-02-09 NOTE — ED Provider Notes (Signed)
CSN: [id number]     Arrival date & time 02/09/16  1735 History   First MD Initiated Contact with Patient 02/09/16 1833     Chief Complaint  Patient presents with  . Fever   (Consider location/radiation/quality/duration/timing/severity/associated sxs/prior Treatment) 35 year old male presents to clinic with 12 hour history of fever and congestion. Patient denies sore throat, nausea, vomiting, diarrhea, or body aches. Patient has been eating and drinking normally with no change in output.    The history is provided by the patient.  Fever  Temp source:  Subjective Severity:  Moderate Onset quality:  Gradual Duration:  12 hours Timing:  Constant Progression:  Worsening Chronicity:  New Relieved by:  None tried Ineffective treatments:  None tried Associated symptoms: chills, congestion and rhinorrhea   Associated symptoms: no cough, no diarrhea, no ear pain, no headaches, no myalgias, no nausea, no sore throat and no vomiting     Past Medical History:  Diagnosis Date  . Lyme disease    History reviewed. No pertinent surgical history. History reviewed. No pertinent family history. Social History  Substance Use Topics  . Smoking status: Never Smoker  . Smokeless tobacco: Never Used  . Alcohol use No    Review of Systems  Constitutional: Positive for chills, fatigue and fever. Negative for diaphoresis.  HENT: Positive for congestion and rhinorrhea. Negative for ear pain, sinus pain, sinus pressure and sore throat.   Respiratory: Negative for cough, shortness of breath and wheezing.   Gastrointestinal: Negative for diarrhea, nausea and vomiting.  Musculoskeletal: Negative for myalgias.  Neurological: Negative for headaches.    Allergies  Patient has no known allergies.  Home Medications   Prior to Admission medications   Medication Sig Start Date End Date Taking? Authorizing Provider  guaiFENesin (ROBITUSSIN) 100 MG/5ML liquid Take 10 mLs (200 mg total) by mouth every 4  (four) hours as needed for congestion. 05/09/15   Fayrene HelperBowie Tran, PA-C  ondansetron (ZOFRAN ODT) 4 MG disintegrating tablet 4mg  ODT q4 hours prn nausea/vomit 01/16/16   Melene Planan Floyd, DO  promethazine-dextromethorphan (PROMETHAZINE-DM) 6.25-15 MG/5ML syrup Take 5 mLs by mouth 4 (four) times daily as needed. 05/09/15   Fayrene HelperBowie Tran, PA-C   Meds Ordered and Administered this Visit   Medications  acetaminophen (TYLENOL) tablet 650 mg (650 mg Oral Given 02/09/16 1811)    BP 103/56 (BP Location: Right Arm)   Pulse 94   Temp 101.4 F (38.6 C) (Oral)   Resp 18   SpO2 100%  No data found.   Physical Exam  Constitutional: He is oriented to person, place, and time. He appears well-developed and well-nourished. No distress.  HENT:  Head: Normocephalic.  Right Ear: External ear normal.  Left Ear: External ear normal.  Mouth/Throat: No oropharyngeal exudate.  Eyes: Pupils are equal, round, and reactive to light. Right eye exhibits no discharge. Left eye exhibits no discharge.  Neck: Normal range of motion. Neck supple.  Cardiovascular: Normal rate and regular rhythm.   Pulmonary/Chest: Effort normal and breath sounds normal. No respiratory distress. He has no wheezes.  Abdominal: Soft. Bowel sounds are normal. There is no tenderness. There is no rebound and no guarding.  Lymphadenopathy:    He has no cervical adenopathy.  Neurological: He is alert and oriented to person, place, and time.  Skin: Skin is warm and dry. Capillary refill takes less than 2 seconds. He is not diaphoretic. No pallor.  Psychiatric: He has a normal mood and affect.  Nursing note and vitals reviewed.  Urgent Care Course   Clinical Course     Procedures (including critical care time)  Labs Review Labs Reviewed - No data to display  Imaging Review No results found.   Visual Acuity Review  Right Eye Distance:   Left Eye Distance:   Bilateral Distance:    Right Eye Near:   Left Eye Near:    Bilateral Near:          MDM   1. Viral upper respiratory tract infection   2. Febrile illness    Patient given 650 mg of Tylenol in clinic for fever, recommended rest and fluids, can continue OTC tylenol for fever symptoms. Should symptoms fail to improve follow up with primary care or return to clinic      Dorena BodoLawrence Marialice Newkirk, NP 02/09/16 2047

## 2016-02-09 NOTE — Discharge Instructions (Signed)
Recommend Tylenol for fever and pain management, may take Mucinex DM for cough, drink plenty of fluids and rest. Recommend establishing care with a primary care provider for follow up care.

## 2016-02-15 ENCOUNTER — Emergency Department (HOSPITAL_COMMUNITY): Payer: BLUE CROSS/BLUE SHIELD

## 2016-02-15 ENCOUNTER — Encounter (HOSPITAL_COMMUNITY): Payer: Self-pay | Admitting: Emergency Medicine

## 2016-02-15 ENCOUNTER — Emergency Department (HOSPITAL_COMMUNITY)
Admission: EM | Admit: 2016-02-15 | Discharge: 2016-02-16 | Disposition: A | Discharge status: Home / Self Care | Payer: BLUE CROSS/BLUE SHIELD | Attending: Emergency Medicine | Admitting: Emergency Medicine

## 2016-02-15 DIAGNOSIS — J189 Pneumonia, unspecified organism: Secondary | ICD-10-CM

## 2016-02-15 DIAGNOSIS — J181 Lobar pneumonia, unspecified organism: Secondary | ICD-10-CM | POA: Insufficient documentation

## 2016-02-15 DIAGNOSIS — Z79899 Other long term (current) drug therapy: Secondary | ICD-10-CM | POA: Insufficient documentation

## 2016-02-15 LAB — CBC WITH DIFFERENTIAL/PLATELET
Basophils Absolute: 0 10*3/uL (ref 0.0–0.1)
Basophils Relative: 0 %
EOS ABS: 0.2 10*3/uL (ref 0.0–0.7)
Eosinophils Relative: 3 %
HEMATOCRIT: 39.2 % (ref 39.0–52.0)
HEMOGLOBIN: 13.4 g/dL (ref 13.0–17.0)
LYMPHS ABS: 1.1 10*3/uL (ref 0.7–4.0)
LYMPHS PCT: 20 %
MCH: 29 pg (ref 26.0–34.0)
MCHC: 34.2 g/dL (ref 30.0–36.0)
MCV: 84.8 fL (ref 78.0–100.0)
MONOS PCT: 8 %
Monocytes Absolute: 0.4 10*3/uL (ref 0.1–1.0)
NEUTROS ABS: 3.8 10*3/uL (ref 1.7–7.7)
NEUTROS PCT: 69 %
Platelets: 215 10*3/uL (ref 150–400)
RBC: 4.62 MIL/uL (ref 4.22–5.81)
RDW: 13.1 % (ref 11.5–15.5)
WBC: 5.5 10*3/uL (ref 4.0–10.5)

## 2016-02-15 LAB — COMPREHENSIVE METABOLIC PANEL
ALBUMIN: 3.6 g/dL (ref 3.5–5.0)
ALK PHOS: 60 U/L (ref 38–126)
ALT: 27 U/L (ref 17–63)
ANION GAP: 3 — AB (ref 5–15)
AST: 22 U/L (ref 15–41)
BUN: 11 mg/dL (ref 6–20)
CALCIUM: 9 mg/dL (ref 8.9–10.3)
CHLORIDE: 101 mmol/L (ref 101–111)
CO2: 31 mmol/L (ref 22–32)
Creatinine, Ser: 1.48 mg/dL — ABNORMAL HIGH (ref 0.61–1.24)
GFR calc non Af Amer: >60 mL/min (ref >60)
GLUCOSE: 107 mg/dL — AB (ref 65–99)
POTASSIUM: 4.4 mmol/L (ref 3.5–5.1)
SODIUM: 135 mmol/L (ref 135–145)
Total Bilirubin: 0.3 mg/dL (ref 0.3–1.2)
Total Protein: 7.5 g/dL (ref 6.5–8.1)

## 2016-02-15 LAB — I-STAT CG4 LACTIC ACID, ED: Lactic Acid, Venous: 1.43 mmol/L (ref 0.5–1.9)

## 2016-02-15 MED ORDER — ACETAMINOPHEN 325 MG PO TABS
650.0000 mg | ORAL_TABLET | Freq: Once | ORAL | Status: AC | PRN
  Administered 2016-02-15: 650 mg via ORAL

## 2016-02-15 MED ORDER — ACETAMINOPHEN 325 MG PO TABS
ORAL_TABLET | ORAL | Status: AC
  Filled 2016-02-15: qty 2

## 2016-02-15 NOTE — ED Triage Notes (Signed)
Patient arrives with complaint of cough, hemoptysis, fever, fatigue, body aches, shortness of breath, and head pain. States onset 1 week ago.

## 2016-02-16 MED ORDER — AZITHROMYCIN 250 MG PO TABS: 250.0000 mg | ORAL_TABLET | Freq: Every day | ORAL | 0 refills | Status: DC

## 2016-02-16 MED ORDER — ACETAMINOPHEN 500 MG PO TABS: 500.0000 mg | ORAL_TABLET | Freq: Four times a day (QID) | ORAL | 0 refills | Status: DC | PRN

## 2016-02-16 MED ORDER — BENZONATATE 100 MG PO CAPS: 100.0000 mg | ORAL_CAPSULE | Freq: Three times a day (TID) | ORAL | 0 refills | Status: DC | PRN

## 2016-02-16 MED ORDER — FLUTICASONE PROPIONATE 50 MCG/ACT NA SUSP: 2.0000 | Freq: Every day | NASAL | 0 refills | Status: DC

## 2016-02-16 NOTE — ED Provider Notes (Signed)
[facility]-EMERGENCY DEPT Provider Note   CSN: [id number] Arrival date & time: 02/15/16  2219     History   Chief Complaint Chief Complaint  Patient presents with  . Cough  . Hemoptysis  . Fever    HPI Taylor Davidson is a 35 y.o. male.  Taylor Davidson is a 35 y.o. Male who presents to the ED complaining of one week of cough, fever, fatigue, body aches, postnasal drip and nasal congestion. Patient reports he was seen 5 days ago in urgent care and diagnosed with a viral upper respiratory infection. She reports he's been having fevers persistently since. He reports having an episode of coughing where there is some blood-streaked sputum today. He also reports an episode of posttussive emesis today. He denies any abdominal pain, nausea or diarrhea. No treatments prior to arrival to the emergency department. He denies any shortness of breath. He is not a smoker. He denies chest pain, shortness of breath, dyspnea on exertion, abdominal pain, nausea, diarrhea, urinary symptoms, leg pain, leg swelling, neck pain or rashes. He denies recent long travel, hx of cancer, history of blood clotting disorders or history of PE or DVT.   The history is provided by the patient. No language interpreter was used.  Cough  Associated symptoms include rhinorrhea. Pertinent negatives include no chest pain, no chills, no headaches, no sore throat, no shortness of breath and no wheezing.  Fever   Associated symptoms include vomiting, congestion and cough. Pertinent negatives include no chest pain, no diarrhea, no headaches and no sore throat.    Past Medical History:  Diagnosis Date  . Lyme disease     There are no active problems to display for this patient.   History reviewed. No pertinent surgical history.     Home Medications    Prior to Admission medications   Medication Sig Start Date End Date Taking? Authorizing Provider  acetaminophen (TYLENOL) 500 MG tablet Take 1 tablet (500 mg total) by  mouth every 6 (six) hours as needed. 02/16/16   Everlene FarrierWilliam Nathyn Luiz, PA-C  azithromycin (ZITHROMAX) 250 MG tablet Take 1 tablet (250 mg total) by mouth daily. Take first 2 tablets together, then 1 every day until finished. 02/16/16   Everlene FarrierWilliam Levin Dagostino, PA-C  benzonatate (TESSALON) 100 MG capsule Take 1 capsule (100 mg total) by mouth 3 (three) times daily as needed for cough. 02/16/16   Everlene FarrierWilliam Delesia Martinek, PA-C  fluticasone (FLONASE) 50 MCG/ACT nasal spray Place 2 sprays into both nostrils daily. 02/16/16   Everlene FarrierWilliam Rowland Ericsson, PA-C  guaiFENesin (ROBITUSSIN) 100 MG/5ML liquid Take 10 mLs (200 mg total) by mouth every 4 (four) hours as needed for congestion. 05/09/15   Fayrene HelperBowie Tran, PA-C  ondansetron (ZOFRAN ODT) 4 MG disintegrating tablet 4mg  ODT q4 hours prn nausea/vomit 01/16/16   Melene Planan Floyd, DO  promethazine-dextromethorphan (PROMETHAZINE-DM) 6.25-15 MG/5ML syrup Take 5 mLs by mouth 4 (four) times daily as needed. 05/09/15   Fayrene HelperBowie Tran, PA-C    Family History History reviewed. No pertinent family history.  Social History Social History  Substance Use Topics  . Smoking status: Never Smoker  . Smokeless tobacco: Never Used  . Alcohol use No     Allergies   Patient has no known allergies.   Review of Systems Review of Systems  Constitutional: Positive for fatigue and fever. Negative for chills.  HENT: Positive for congestion, postnasal drip, rhinorrhea and sneezing. Negative for mouth sores, nosebleeds, sore throat and trouble swallowing.   Eyes: Negative for visual disturbance.  Respiratory: Positive for  cough. Negative for shortness of breath and wheezing.   Cardiovascular: Negative for chest pain and palpitations.  Gastrointestinal: Positive for vomiting. Negative for abdominal pain, diarrhea and nausea.  Genitourinary: Negative for dysuria.  Musculoskeletal: Negative for back pain and neck pain.  Skin: Negative for rash.  Neurological: Negative for syncope, light-headedness and headaches.      Physical Exam Updated Vital Signs BP 101/60   Pulse 79   Temp 99.2 F (37.3 C) (Oral)   Resp 18   Ht 6\' 3"  (1.905 m)   Wt 97.5 kg   SpO2 100%   BMI 26.87 kg/m   Physical Exam  Constitutional: He appears well-developed and well-nourished. No distress.  Nontoxic appearing.  HENT:  Head: Normocephalic and atraumatic.  Right Ear: External ear normal.  Left Ear: External ear normal.  Mouth/Throat: Oropharynx is clear and moist.  Mild middle ear effusion noted bilaterally. No TM erythema or loss of landmarks. Boggy nasal turbinates bilaterally. Throat is clear.  Eyes: Conjunctivae are normal. Pupils are equal, round, and reactive to light. Right eye exhibits no discharge. Left eye exhibits no discharge.  Neck: Normal range of motion. Neck supple. No JVD present. No tracheal deviation present.  Cardiovascular: Normal rate, regular rhythm, normal heart sounds and intact distal pulses.  Exam reveals no gallop and no friction rub.   No murmur heard. Pulmonary/Chest: Effort normal. No stridor. No respiratory distress. He has no wheezes.  Slightly diminished lung sounds to his right base with slight crackle. No wheezing. No increased work of breathing. Oxygen saturation 100% on room air.  Abdominal: Soft. There is no tenderness.  Musculoskeletal: He exhibits no edema or tenderness.  No lower extremity edema or tenderness.  Lymphadenopathy:    He has no cervical adenopathy.  Neurological: He is alert. Coordination normal.  Skin: Skin is warm and dry. Capillary refill takes less than 2 seconds. No rash noted. He is not diaphoretic. No erythema. No pallor.  Psychiatric: He has a normal mood and affect. His behavior is normal.  Nursing note and vitals reviewed.    ED Treatments / Results  Labs (all labs ordered are listed, but only abnormal results are displayed) Labs Reviewed  COMPREHENSIVE METABOLIC PANEL - Abnormal; Notable for the following:       Result Value   Glucose,  Bld 107 (*)    Creatinine, Ser 1.48 (*)    Anion gap 3 (*)    All other components within normal limits  CBC WITH DIFFERENTIAL/PLATELET  I-STAT CG4 LACTIC ACID, ED    EKG  EKG Interpretation None       Radiology Dg Chest 2 View  Result Date: 02/15/2016 CLINICAL DATA:  Cough and fever. EXAM: CHEST  2 VIEW COMPARISON:  05/09/2015 FINDINGS: Patchy right upper lobe opacity consistent with pneumonia. Possible minimal right infrahilar opacity in the lower lobe. Left lung is clear. Low lung volumes. Normal heart size and mediastinal contours for degree of inflation. No pulmonary edema, pleural fluid or pneumothorax. No acute osseous abnormality. IMPRESSION: Right upper lobe pneumonia. Possible additional focus of pneumonia in the right lower lobe. Electronically Signed   By: Rubye OaksMelanie  Ehinger M.D.   On: 02/15/2016 23:07    Procedures Procedures (including critical care time)  Medications Ordered in ED Medications  acetaminophen (TYLENOL) 325 MG tablet (not administered)  acetaminophen (TYLENOL) tablet 650 mg (650 mg Oral Given 02/15/16 2235)     Initial Impression / Assessment and Plan / ED Course  I have reviewed the triage vital  signs and the nursing notes.  Pertinent labs & imaging results that were available during my care of the patient were reviewed by me and considered in my medical decision making (see chart for details).  Clinical Course    This  is a 35 y.o. Male who presents to the ED complaining of one week of cough, fever, fatigue, body aches, postnasal drip and nasal congestion. Patient reports he was seen 5 days ago in urgent care and diagnosed with a viral upper respiratory infection. She reports he's been having fevers persistently since. He reports having an episode of coughing where there is some blood-streaked sputum today. He also reports an episode of posttussive emesis today. He denies any abdominal pain, nausea or diarrhea. No treatments prior to arrival to the  emergency department. He denies any shortness of breath. He is not a smoker. He denies chest pain, shortness of breath, dyspnea on exertion.  On arrival to the emergency department the patient has a temperature of 100.7. On my exam the patient is nontoxic appearing. His slightly diminished lung sounds right base but this slight crackle in his right lung fields. No increased work of breathing. Oxygen saturation is 100% on room air. Lactic acid ordered by triage. This is within normal limits. CMP is remarkable for creatinine of 1.48. This appears to be his baseline. Patient with recent blood work that shows a baseline around 1.5. CBC with no leukocytosis.  Chest x-ray shows right upper lobe pneumonia. Patient ambulated in the emergency department without hypoxia. He denies any shortness of breath. He has no tachypnea or hypoxia. Will discharge with treatment for community-acquired pneumonia with azithromycin. Symptomatic treatment with Tylenol, Tessalon Perles and Flonase for his nasal congestion. I encouraged him to follow-up with her primary care provider to have his kidney function rechecked. I discussed her specific return precautions. I advised the patient to follow-up with their primary care provider this week. I advised the patient to return to the emergency department with new or worsening symptoms or new concerns. The patient verbalized understanding and agreement with plan.    Final Clinical Impressions(s) / ED Diagnoses   Final diagnoses:  Community acquired pneumonia of right upper lobe of lung (HCC)    New Prescriptions New Prescriptions   ACETAMINOPHEN (TYLENOL) 500 MG TABLET    Take 1 tablet (500 mg total) by mouth every 6 (six) hours as needed.   AZITHROMYCIN (ZITHROMAX) 250 MG TABLET    Take 1 tablet (250 mg total) by mouth daily. Take first 2 tablets together, then 1 every day until finished.   BENZONATATE (TESSALON) 100 MG CAPSULE    Take 1 capsule (100 mg total) by mouth 3 (three)  times daily as needed for cough.   FLUTICASONE (FLONASE) 50 MCG/ACT NASAL SPRAY    Place 2 sprays into both nostrils daily.     Everlene Farrier, PA-C 02/16/16 1610    Shon Baton, MD 02/16/16 0630

## 2016-02-16 NOTE — Discharge Instructions (Signed)
Please make an appointment for follow-up with the primary care provider to have your kidney function rechecked.

## 2016-02-16 NOTE — ED Notes (Signed)
Ambulated patient with pulse Ox. Oxygen saturation remained 99, only dropped to 96 when patient was coughing. Before arriving to patient room, Patient did report some dizziness while ambulating.

## 2016-03-03 ENCOUNTER — Emergency Department (HOSPITAL_BASED_OUTPATIENT_CLINIC_OR_DEPARTMENT_OTHER)
Admission: EM | Admit: 2016-03-03 | Discharge: 2016-03-03 | Disposition: A | Discharge status: Home / Self Care | Payer: Self-pay | Attending: Emergency Medicine | Admitting: Emergency Medicine

## 2016-03-03 ENCOUNTER — Emergency Department (HOSPITAL_BASED_OUTPATIENT_CLINIC_OR_DEPARTMENT_OTHER): Payer: Self-pay

## 2016-03-03 ENCOUNTER — Encounter (HOSPITAL_BASED_OUTPATIENT_CLINIC_OR_DEPARTMENT_OTHER): Payer: Self-pay | Admitting: *Deleted

## 2016-03-03 DIAGNOSIS — R5383 Other fatigue: Secondary | ICD-10-CM | POA: Insufficient documentation

## 2016-03-03 DIAGNOSIS — R52 Pain, unspecified: Secondary | ICD-10-CM | POA: Insufficient documentation

## 2016-03-03 DIAGNOSIS — R110 Nausea: Secondary | ICD-10-CM | POA: Insufficient documentation

## 2016-03-03 DIAGNOSIS — R05 Cough: Secondary | ICD-10-CM | POA: Insufficient documentation

## 2016-03-03 LAB — CBC WITH DIFFERENTIAL/PLATELET
BAND NEUTROPHILS: 1 %
BASOS PCT: 0 %
Basophils Absolute: 0 10*3/uL (ref 0.0–0.1)
EOS ABS: 0.1 10*3/uL (ref 0.0–0.7)
Eosinophils Relative: 2 %
HEMATOCRIT: 40.3 % (ref 39.0–52.0)
Hemoglobin: 13.4 g/dL (ref 13.0–17.0)
Lymphocytes Relative: 39 %
Lymphs Abs: 1.7 10*3/uL (ref 0.7–4.0)
MCH: 28.3 pg (ref 26.0–34.0)
MCHC: 33.3 g/dL (ref 30.0–36.0)
MCV: 85.2 fL (ref 78.0–100.0)
MONO ABS: 0.2 10*3/uL (ref 0.1–1.0)
MONOS PCT: 5 %
NEUTROS ABS: 2.4 10*3/uL (ref 1.7–7.7)
Neutrophils Relative %: 53 %
PLATELETS: 241 10*3/uL (ref 150–400)
RBC: 4.73 MIL/uL (ref 4.22–5.81)
RDW: 13.3 % (ref 11.5–15.5)
WBC: 4.4 10*3/uL (ref 4.0–10.5)

## 2016-03-03 LAB — COMPREHENSIVE METABOLIC PANEL
ALBUMIN: 4 g/dL (ref 3.5–5.0)
ALK PHOS: 52 U/L (ref 38–126)
ALT: 18 U/L (ref 17–63)
AST: 19 U/L (ref 15–41)
Anion gap: 7 (ref 5–15)
BILIRUBIN TOTAL: 0.6 mg/dL (ref 0.3–1.2)
BUN: 18 mg/dL (ref 6–20)
CALCIUM: 9.5 mg/dL (ref 8.9–10.3)
CO2: 27 mmol/L (ref 22–32)
Chloride: 102 mmol/L (ref 101–111)
Creatinine, Ser: 1.12 mg/dL (ref 0.61–1.24)
GFR calc Af Amer: >60 mL/min (ref >60)
GFR calc non Af Amer: >60 mL/min (ref >60)
GLUCOSE: 90 mg/dL (ref 65–99)
Potassium: 3.9 mmol/L (ref 3.5–5.1)
Sodium: 136 mmol/L (ref 135–145)
TOTAL PROTEIN: 7.8 g/dL (ref 6.5–8.1)

## 2016-03-03 LAB — URINALYSIS, ROUTINE W REFLEX MICROSCOPIC
Bilirubin Urine: NEGATIVE
GLUCOSE, UA: NEGATIVE mg/dL
Hgb urine dipstick: NEGATIVE
KETONES UR: NEGATIVE mg/dL
LEUKOCYTES UA: NEGATIVE
Nitrite: NEGATIVE
PH: 7 (ref 5.0–8.0)
Protein, ur: NEGATIVE mg/dL
SPECIFIC GRAVITY, URINE: 1.017 (ref 1.005–1.030)

## 2016-03-03 LAB — LIPASE, BLOOD: Lipase: 26 U/L (ref 11–51)

## 2016-03-03 MED ORDER — SODIUM CHLORIDE 0.9 % IV BOLUS (SEPSIS)
1000.0000 mL | Freq: Once | INTRAVENOUS | Status: AC
  Administered 2016-03-03: 1000 mL via INTRAVENOUS

## 2016-03-03 NOTE — ED Provider Notes (Signed)
[facility] DEPT Provider Note   CSN: [id number] Arrival date & time: 03/03/16 1320     History    Chief Complaint  Patient presents with  . Generalized Body Aches  . Fever     HPI Taylor Davidson is a 36 y.o. male.  36yo M who p/w fatigue, malaise, and nausea. Patient presented here 3 weeks ago for URI symptoms. He was diagnosed with a pneumonia from chest x-ray and was discharged with azithromycin which he completed. He reports that his cough is still present but is much improved since taking the antibiotics. However, he still does not feel well and has had ongoing nausea, body aches, fatigue, and generalized malaise. He endorses sweating but no fevers. He endorses mild headache currently. He denies any skin changes, recent travel, or sick contacts. He states he feels dehydrated but he reports normal PO intake and normal urination. No diarrhea or blood in his stool. He has chronic nasal congestion but denies any cold symptoms aside from his mild cough. He denies h/o IVDU, hepatitis, or alcohol use.  Past Medical History:  Diagnosis Date  . Lyme disease      There are no active problems to display for this patient.   History reviewed. No pertinent surgical history.      Home Medications    Prior to Admission medications   Medication Sig Start Date End Date Taking? Authorizing Provider  acetaminophen (TYLENOL) 500 MG tablet Take 1 tablet (500 mg total) by mouth every 6 (six) hours as needed. 02/16/16   Everlene FarrierWilliam Dansie, PA-C  azithromycin (ZITHROMAX) 250 MG tablet Take 1 tablet (250 mg total) by mouth daily. Take first 2 tablets together, then 1 every day until finished. 02/16/16   Everlene FarrierWilliam Dansie, PA-C  benzonatate (TESSALON) 100 MG capsule Take 1 capsule (100 mg total) by mouth 3 (three) times daily as needed for cough. 02/16/16   Everlene FarrierWilliam Dansie, PA-C  fluticasone (FLONASE) 50 MCG/ACT nasal spray Place 2 sprays into both nostrils daily. 02/16/16   Everlene FarrierWilliam Dansie, PA-C    guaiFENesin (ROBITUSSIN) 100 MG/5ML liquid Take 10 mLs (200 mg total) by mouth every 4 (four) hours as needed for congestion. 05/09/15   Fayrene HelperBowie Tran, PA-C  ondansetron (ZOFRAN ODT) 4 MG disintegrating tablet 4mg  ODT q4 hours prn nausea/vomit 01/16/16   Melene Planan Floyd, DO  promethazine-dextromethorphan (PROMETHAZINE-DM) 6.25-15 MG/5ML syrup Take 5 mLs by mouth 4 (four) times daily as needed. 05/09/15   Fayrene HelperBowie Tran, PA-C      No family history on file.   Social History  Substance Use Topics  . Smoking status: Never Smoker  . Smokeless tobacco: Never Used  . Alcohol use No     Allergies     Patient has no known allergies.    Review of Systems  10 Systems reviewed and are negative for acute change except as noted in the HPI.   Physical Exam Updated Vital Signs BP 127/72   Pulse 88   Temp 98.2 F (36.8 C) (Oral)   Resp 18   Ht 6\' 3"  (1.905 m)   Wt 215 lb (97.5 kg)   SpO2 100%   BMI 26.87 kg/m   Physical Exam  Constitutional: He is oriented to person, place, and time. He appears well-developed and well-nourished. No distress.  HENT:  Head: Normocephalic and atraumatic.  Right Ear: Tympanic membrane and ear canal normal.  Left Ear: Tympanic membrane and ear canal normal.  Moist mucous membranes  Eyes: Conjunctivae are normal. Pupils are equal, round, and reactive  to light.  Neck: Neck supple.  Cardiovascular: Normal rate, regular rhythm and normal heart sounds.   No murmur heard. Pulmonary/Chest: Effort normal and breath sounds normal.  Abdominal: Soft. Bowel sounds are normal. He exhibits no distension. There is no tenderness.  Musculoskeletal: He exhibits no edema.  Neurological: He is alert and oriented to person, place, and time.  Fluent speech  Skin: Skin is warm and dry.  Psychiatric: He has a normal mood and affect. Judgment normal.  Nursing note and vitals reviewed.     ED Treatments / Results  Labs (all labs ordered are listed, but only abnormal results  are displayed) Labs Reviewed  COMPREHENSIVE METABOLIC PANEL  CBC WITH DIFFERENTIAL/PLATELET  LIPASE, BLOOD  URINALYSIS, ROUTINE W REFLEX MICROSCOPIC  HIV ANTIBODY (ROUTINE TESTING)     EKG  EKG Interpretation  Date/Time:    Ventricular Rate:    PR Interval:    QRS Duration:   QT Interval:    QTC Calculation:   R Axis:     Text Interpretation:           Radiology Dg Chest 2 View  Result Date: 03/03/2016 CLINICAL DATA:  Cough and congestion.  Recent pneumonia. EXAM: CHEST  2 VIEW COMPARISON:  February 15, 2016 FINDINGS: Previous infiltrate on the right has cleared. Currently lungs are clear. Heart size and pulmonary vascularity are normal. No adenopathy. No bone lesions. IMPRESSION: Lungs now clear. Electronically Signed   By: Bretta Bang III M.D.   On: 03/03/2016 14:11    Procedures Procedures (including critical care time) Procedures  Medications Ordered in ED  Medications  sodium chloride 0.9 % bolus 1,000 mL (1,000 mLs Intravenous New Bag/Given 03/03/16 1423)     Initial Impression / Assessment and Plan / ED Course  I have reviewed the triage vital signs and the nursing notes.  Pertinent labs & imaging results that were available during my care of the patient were reviewed by me and considered in my medical decision making (see chart for details).  Clinical Course     Pt w/ ongoing malaise, nausea, fatigue, and body aches after treatment for CAP 3 weeks ago. He was well-appearing with normal vital signs. Physical exam reassuring with clear breath sounds. Obtained above lab work to evaluate for anemia or dehydration. Also obtained a repeat chest x-ray to confirm improvement of infiltrate. Gave an IV fluid bolus.  Chest x-ray shows resolution of pneumonia and no acute findings. All lab work unremarkable with no evidence of infection or dehydration. Given the patient's normal workup and normal vital signs, I feel he is safe for outpatient follow-up. I have  discussed workup findings and follow-up plan for further testing such as thyroid studies. I reviewed return precautions including shortness of breath, unexplained fever, or sudden worsening of symptoms. Patient voiced understanding and was discharged in satisfactory condition.  Final Clinical Impressions(s) / ED Diagnoses   Final diagnoses:  Fatigue, unspecified type  Body aches     New Prescriptions   No medications on file       Laurence Spates, MD 03/03/16 1551

## 2016-03-03 NOTE — ED Triage Notes (Signed)
States he was treated for pneumonia 3 weeks ago. Has not been completely well since. Nausea, lethargy and dizziness.

## 2016-03-04 LAB — HIV ANTIBODY (ROUTINE TESTING W REFLEX): HIV SCREEN 4TH GENERATION: NONREACTIVE

## 2016-03-06 ENCOUNTER — Emergency Department (HOSPITAL_BASED_OUTPATIENT_CLINIC_OR_DEPARTMENT_OTHER)
Admission: EM | Admit: 2016-03-06 | Discharge: 2016-03-06 | Disposition: A | Discharge status: Home / Self Care | Payer: BLUE CROSS/BLUE SHIELD | Attending: Emergency Medicine | Admitting: Emergency Medicine

## 2016-03-06 ENCOUNTER — Encounter (HOSPITAL_BASED_OUTPATIENT_CLINIC_OR_DEPARTMENT_OTHER): Payer: Self-pay | Admitting: Emergency Medicine

## 2016-03-06 DIAGNOSIS — R531 Weakness: Secondary | ICD-10-CM | POA: Insufficient documentation

## 2016-03-06 DIAGNOSIS — R109 Unspecified abdominal pain: Secondary | ICD-10-CM | POA: Insufficient documentation

## 2016-03-06 DIAGNOSIS — R079 Chest pain, unspecified: Secondary | ICD-10-CM | POA: Insufficient documentation

## 2016-03-06 DIAGNOSIS — R1110 Vomiting, unspecified: Secondary | ICD-10-CM | POA: Insufficient documentation

## 2016-03-06 DIAGNOSIS — M791 Myalgia: Secondary | ICD-10-CM | POA: Insufficient documentation

## 2016-03-06 LAB — CBC WITH DIFFERENTIAL/PLATELET
Basophils Absolute: 0 10*3/uL (ref 0.0–0.1)
Basophils Relative: 0 %
EOS ABS: 0.1 10*3/uL (ref 0.0–0.7)
EOS PCT: 2 %
HCT: 42 % (ref 39.0–52.0)
Hemoglobin: 14.1 g/dL (ref 13.0–17.0)
LYMPHS ABS: 1.8 10*3/uL (ref 0.7–4.0)
Lymphocytes Relative: 40 %
MCH: 28.3 pg (ref 26.0–34.0)
MCHC: 33.6 g/dL (ref 30.0–36.0)
MCV: 84.2 fL (ref 78.0–100.0)
MONO ABS: 0.4 10*3/uL (ref 0.1–1.0)
Monocytes Relative: 8 %
NEUTROS PCT: 50 %
Neutro Abs: 2.1 10*3/uL (ref 1.7–7.7)
PLATELETS: 218 10*3/uL (ref 150–400)
RBC: 4.99 MIL/uL (ref 4.22–5.81)
RDW: 13.2 % (ref 11.5–15.5)
WBC: 4.4 10*3/uL (ref 4.0–10.5)

## 2016-03-06 LAB — COMPREHENSIVE METABOLIC PANEL
ALT: 22 U/L (ref 17–63)
AST: 20 U/L (ref 15–41)
Albumin: 4.3 g/dL (ref 3.5–5.0)
Alkaline Phosphatase: 57 U/L (ref 38–126)
Anion gap: 6 (ref 5–15)
BUN: 14 mg/dL (ref 6–20)
CALCIUM: 9.5 mg/dL (ref 8.9–10.3)
CO2: 29 mmol/L (ref 22–32)
CREATININE: 1.14 mg/dL (ref 0.61–1.24)
Chloride: 101 mmol/L (ref 101–111)
Glucose, Bld: 83 mg/dL (ref 65–99)
Potassium: 3.7 mmol/L (ref 3.5–5.1)
Sodium: 136 mmol/L (ref 135–145)
TOTAL PROTEIN: 8.5 g/dL — AB (ref 6.5–8.1)
Total Bilirubin: 0.5 mg/dL (ref 0.3–1.2)

## 2016-03-06 LAB — TROPONIN I

## 2016-03-06 MED ORDER — SODIUM CHLORIDE 0.9 % IV BOLUS (SEPSIS)
1000.0000 mL | Freq: Once | INTRAVENOUS | Status: AC
  Administered 2016-03-06: 1000 mL via INTRAVENOUS

## 2016-03-06 NOTE — ED Provider Notes (Signed)
[name]   CSN: [id number] Arrival date & time: 03/06/16  1456   By signing my name below, I, Avnee Patel, attest that this documentation has been prepared under the direction and in the presence of Orlie Dakin, MD  Electronically Signed: Delton Prairie, ED Scribe. 03/06/16. 4:44 PM.   History   Chief Complaint Chief Complaint  Patient presents with  . Dizziness   The history is provided by the patient. No language interpreter was used.   HPI Comments:  Taylor Davidson is a 36 y.o. male, with a hx of pneumonia, who presents to the Emergency Department complaining of persistent fatigue x several days. Pt also reports dizziness, lightheadedness, 1 episode of vomiting today and a decrease of fluid intake. Pt also reports aching pain to his chest and abdomen which is worse with walking. Sitting upright and breathing does not exacerbate hisPain though increases lightheadedness. No alleviating factors noted. Pt denies nausea and diarrhea. Pt was last seen in the ED on 03/03/16 for similar symptoms, had a chest x-ray and was given IV fluids. He had CBC C met HIV test lipase all of which were normal.   Past Medical History:  Diagnosis Date  . Lyme disease     There are no active problems to display for this patient.   History reviewed. No pertinent surgical history.     Home Medications    Prior to Admission medications   Medication Sig Start Date End Date Taking? Authorizing Provider  acetaminophen (TYLENOL) 500 MG tablet Take 1 tablet (500 mg total) by mouth every 6 (six) hours as needed. 02/16/16   Waynetta Pean, PA-C  azithromycin (ZITHROMAX) 250 MG tablet Take 1 tablet (250 mg total) by mouth daily. Take first 2 tablets together, then 1 every day until finished. 02/16/16   Waynetta Pean, PA-C  benzonatate (TESSALON) 100 MG capsule Take 1 capsule (100 mg total) by mouth 3 (three) times daily as needed for cough. 02/16/16   Waynetta Pean, PA-C    fluticasone (FLONASE) 50 MCG/ACT nasal spray Place 2 sprays into both nostrils daily. 02/16/16   Waynetta Pean, PA-C  guaiFENesin (ROBITUSSIN) 100 MG/5ML liquid Take 10 mLs (200 mg total) by mouth every 4 (four) hours as needed for congestion. 05/09/15   Domenic Moras, PA-C  ondansetron (ZOFRAN ODT) 4 MG disintegrating tablet 57m ODT q4 hours prn nausea/vomit 01/16/16   DDeno Etienne DO  promethazine-dextromethorphan (PROMETHAZINE-DM) 6.25-15 MG/5ML syrup Take 5 mLs by mouth 4 (four) times daily as needed. 05/09/15   BDomenic Moras PA-C    Family History No family history on file.  Social History Social History  Substance Use Topics  . Smoking status: Never Smoker  . Smokeless tobacco: Never Used  . Alcohol use No     Allergies   Patient has no known allergies.   Review of Systems Review of Systems  Constitutional: Positive for fatigue.  HENT: Negative.   Respiratory: Negative.  Negative for cough.   Cardiovascular: Positive for chest pain.       Syncope  Gastrointestinal: Positive for abdominal pain and vomiting. Negative for diarrhea and nausea.  Musculoskeletal: Positive for myalgias.  Skin: Negative.   Allergic/Immunologic: Negative.   Neurological: Positive for dizziness and light-headedness.  Psychiatric/Behavioral: Negative.   All other systems reviewed and are negative.    Physical Exam Updated Vital Signs BP 131/88 (BP Location: Left Arm)   Pulse 88   Temp 97.8 F (36.6 C) (Oral)   Resp 17   Ht  _0  (1.905 m)   Wt 210 lb (95.3 kg)   SpO2 100%   BMI 26.25 kg/m   Physical Exam  Constitutional: He is oriented to person, place, and time. He appears well-developed and well-nourished. No distress.  HENT:  Head: Normocephalic and atraumatic.  Bilateral tympanic membranes normal  Eyes: Conjunctivae are normal. Pupils are equal, round, and reactive to light.  Neck: Neck supple. No tracheal deviation present. No thyromegaly present.  Cardiovascular: Normal rate,  regular rhythm and normal heart sounds.   No murmur heard. Pulmonary/Chest: Effort normal and breath sounds normal.  Abdominal: Soft. Bowel sounds are normal. He exhibits no distension. There is no tenderness.  Genitourinary: Penis normal.  Musculoskeletal: Normal range of motion. He exhibits no edema or tenderness.  Neurological: He is alert and oriented to person, place, and time. Coordination normal.  Gait normal motor strength 5 over 5 overall  Skin: Skin is warm and dry. No rash noted.  Psychiatric: He has a normal mood and affect.  Nursing note and vitals reviewed.  Clinical course 6:50 PM patient resting comfortably. No distress. Speech clear. Patient exhibits symptoms of orthostasis. Suggest oral hydration. He is to call the [facility] in 2 days to schedule follow-up appointment ED Treatments / Results  DIAGNOSTIC STUDIES:  Oxygen Saturation is 100% on RA, normal by my interpretation.    COORDINATION OF CARE:  4:43 PM Discussed treatment plan with pt at bedside and pt agreed to plan.  Labs (all labs ordered are listed, but only abnormal results are displayed) Labs Reviewed - No data to display  EKG  EKG Interpretation  Date/Time:  Sunday March 06 2016 17:17:45 EST Ventricular Rate:  65 PR Interval:    QRS Duration: 94 QT Interval:  389 QTC Calculation: 405 R Axis:   60 Text Interpretation:  Sinus rhythm ST elev, probable normal early repol pattern No significant change since last tracing Confirmed by Winfred Leeds  MD, Tiphanie Vo [phone]) on 03/06/2016 5:31:00 PM      Results for orders placed or performed during the hospital encounter of 03/06/16  Comprehensive metabolic panel  Result Value Ref Range   Sodium 136 135 - 145 mmol/L   Potassium 3.7 3.5 - 5.1 mmol/L   Chloride 101 101 - 111 mmol/L   CO2 29 22 - 32 mmol/L   Glucose, Bld 83 65 - 99 mg/dL   BUN 14 6 - 20 mg/dL   Creatinine, Ser 1.14 0.61 - 1.24 mg/dL   Calcium 9.5 8.9 - 10.3  mg/dL   Total Protein 8.5 (H) 6.5 - 8.1 g/dL   Albumin 4.3 3.5 - 5.0 g/dL   AST 20 15 - 41 U/L   ALT 22 17 - 63 U/L   Alkaline Phosphatase 57 38 - 126 U/L   Total Bilirubin 0.5 0.3 - 1.2 mg/dL   GFR calc non Af Amer >60 >60 mL/min   GFR calc Af Amer >60 >60 mL/min   Anion gap 6 5 - 15  CBC with Differential/Platelet  Result Value Ref Range   WBC 4.4 4.0 - 10.5 K/uL   RBC 4.99 4.22 - 5.81 MIL/uL   Hemoglobin 14.1 13.0 - 17.0 g/dL   HCT 42.0 39.0 - 52.0 %   MCV 84.2 78.0 - 100.0 fL   MCH 28.3 26.0 - 34.0 pg   MCHC 33.6 30.0 - 36.0 g/dL   RDW 13.2 11.5 - 15.5 %   Platelets 218 150 - 400 K/uL   Neutrophils Relative %  50 %   Lymphocytes Relative 40 %   Monocytes Relative 8 %   Eosinophils Relative 2 %   Basophils Relative 0 %   Neutro Abs 2.1 1.7 - 7.7 K/uL   Lymphs Abs 1.8 0.7 - 4.0 K/uL   Monocytes Absolute 0.4 0.1 - 1.0 K/uL   Eosinophils Absolute 0.1 0.0 - 0.7 K/uL   Basophils Absolute 0.0 0.0 - 0.1 K/uL   WBC Morphology ATYPICAL LYMPHOCYTES   Troponin I  Result Value Ref Range   Troponin I <0.03 <0.03 ng/mL   Dg Chest 2 View  Result Date: 03/03/2016 CLINICAL DATA:  Cough and congestion.  Recent pneumonia. EXAM: CHEST  2 VIEW COMPARISON:  February 15, 2016 FINDINGS: Previous infiltrate on the right has cleared. Currently lungs are clear. Heart size and pulmonary vascularity are normal. No adenopathy. No bone lesions. IMPRESSION: Lungs now clear. Electronically Signed   By: Lowella Grip III M.D.   On: 03/03/2016 14:11   Dg Chest 2 View  Result Date: 02/15/2016 CLINICAL DATA:  Cough and fever. EXAM: CHEST  2 VIEW COMPARISON:  05/09/2015 FINDINGS: Patchy right upper lobe opacity consistent with pneumonia. Possible minimal right infrahilar opacity in the lower lobe. Left lung is clear. Low lung volumes. Normal heart size and mediastinal contours for degree of inflation. No pulmonary edema, pleural fluid or pneumothorax. No acute osseous abnormality. IMPRESSION: Right upper  lobe pneumonia. Possible additional focus of pneumonia in the right lower lobe. Electronically Signed   By: Jeb Levering M.D.   On: 02/15/2016 23:07    Radiology No results found.  Procedures Procedures (including critical care time)  Medications Ordered in ED Medications - No data to display   Initial Impression / Assessment and Plan / ED Course  I have reviewed the triage vital signs and the nursing notes.  Pertinent labs & imaging results that were available during my care of the patient were reviewed by me and considered in my medical decision making (see chart for details).  Clinical Course       Final Clinical Impressions(s) / ED Diagnoses   Final diagnoses:  None   Diagnoses generalized weakness New Prescriptions New Prescriptions   No medications on file       Orlie Dakin, MD 03/06/16 1900

## 2016-03-06 NOTE — Discharge Instructions (Signed)
Make sure that you drink at least six 8 ounce glasses of water or Gatorade each day in order to stay well hydrated. Contact the [facility] in 2 days to schedule the next available appointment. Return if concern for any reason or see an urgent care center

## 2016-03-06 NOTE — ED Triage Notes (Signed)
Pt states dizziness and weakness for the past 5-6 days.  Pt seen here 3 days ago for same and advised that if symptoms do not improve he should return.  Pt states symptoms unresolved since last visit.  Pt states he vomited once today.

## 2016-03-10 ENCOUNTER — Inpatient Hospital Stay: Payer: BLUE CROSS/BLUE SHIELD

## 2016-03-11 ENCOUNTER — Ambulatory Visit: Payer: Self-pay | Attending: Internal Medicine | Admitting: Physician Assistant

## 2016-03-11 VITALS — BP 112/76 | HR 90 | Temp 97.6°F | Resp 16 | Wt 204.2 lb

## 2016-03-11 DIAGNOSIS — R9389 Abnormal findings on diagnostic imaging of other specified body structures: Secondary | ICD-10-CM

## 2016-03-11 DIAGNOSIS — R42 Dizziness and giddiness: Secondary | ICD-10-CM | POA: Insufficient documentation

## 2016-03-11 DIAGNOSIS — R5383 Other fatigue: Secondary | ICD-10-CM | POA: Insufficient documentation

## 2016-03-11 DIAGNOSIS — R112 Nausea with vomiting, unspecified: Secondary | ICD-10-CM | POA: Insufficient documentation

## 2016-03-11 DIAGNOSIS — R918 Other nonspecific abnormal finding of lung field: Secondary | ICD-10-CM | POA: Insufficient documentation

## 2016-03-11 DIAGNOSIS — R531 Weakness: Secondary | ICD-10-CM | POA: Insufficient documentation

## 2016-03-11 DIAGNOSIS — A6920 Lyme disease, unspecified: Secondary | ICD-10-CM | POA: Insufficient documentation

## 2016-03-11 DIAGNOSIS — H6982 Other specified disorders of Eustachian tube, left ear: Secondary | ICD-10-CM

## 2016-03-11 DIAGNOSIS — R938 Abnormal findings on diagnostic imaging of other specified body structures: Secondary | ICD-10-CM

## 2016-03-11 LAB — TSH: TSH: 0.64 mIU/L (ref 0.40–4.50)

## 2016-03-11 MED ORDER — ONDANSETRON 4 MG PO TBDP: ORAL_TABLET | ORAL | 0 refills | Status: AC

## 2016-03-11 MED ORDER — FLUTICASONE PROPIONATE 50 MCG/ACT NA SUSP: 2.0000 | Freq: Every day | NASAL | 3 refills | Status: AC

## 2016-03-11 MED FILL — FLUTICASONE PROP 50 MCG SPR: 50 | 20 days supply | Qty: 16 | Fill #0

## 2016-03-11 MED FILL — ONDANSETRON ODT 4 MG TABLET: 4 | 5 days supply | Qty: 30 | Fill #0

## 2016-03-11 NOTE — Progress Notes (Signed)
Pt is in the office today for a ED follow up P states he is dizzy right now Pt states he is weak as well PT states his pain level today in the office is a 10 Pt states his pain is coming form his head

## 2016-03-11 NOTE — Patient Instructions (Addendum)
Return to have test read between 8:30am and 11:30am Monday, January 22.  Tuberculin Skin Test Why am I having this test? Tuberculosis (TB) is a bacterial infection caused by Mycobacterium tuberculosis. Most people who are exposed to these bacteria have a strong enough defense (immune) system to prevent the bacteria from causing TB and developing symptoms. Their bodies prevent the germs from being active and making them sick (latent TB infection). However, if you have TB germs in your body and your immune system is weak, you can develop a TB infection. This can cause symptoms such as:  Night sweats.  Fever.  Weakness.  Weight loss. A latent TB infection can also become active later in life if your immune system becomes weakened or compromised. You may have this test if your health care provider suspects that you have TB. You may also have this test to screen for TB if you are at risk for getting the disease. Those at increased risk include:  People who inject illegal drugs or share needles.  People with HIV or other diseases that affect immunity.  Health care workers.  People who live in high-risk communities, such as homeless shelters, nursing homes, and correctional facilities.  People who have been in contact with someone with TB.  People from countries where TB is more common. If you are in a high-risk group, your health care provider may wish to screen for TB more often. This can help prevent the spread of the disease. Sometimes TB screening is required when starting a new job, such as becoming a Scientist, forensic or a Runner, broadcasting/film/video. Colleges or universities may require it of new students. What is being tested? A tuberculin skin test is the main test used to check for exposure to the bacteria that can cause TB. The test checks for antibodies to the bacteria. Antibodies are proteins that your body produces to protect you from germs and other things that can make you sick. Your health  care provider will inject a solution known as PPD (purified protein derivative) under the first layer of skin on your arm. This causes a blister-like bubble to form at the site. Your health care provider will then examine the site after a number of hours have passed to see if a reaction has occurred. How do I prepare for this test? There is no preparation required for this test. What do the results mean? Your test results will be reported as either negative or positive. If the tuberculin skin test produces a negative result, it is likely that you do not have TB and have not been exposed to the TB bacteria. If you or your health care provider suspects exposure, however, you may want to repeat the test a few weeks later. A blood test may also be used to check for TB. This is because you will not react to the tuberculin skin test until several weeks after exposure to TB bacteria. If you test positive to the tuberculin skin test, it is likely that you have been exposed to TB bacteria. The test does not distinguish between an active and a latent TB infection. A false-positive result can occur. A false-positive result for TB bacteria is incorrect because it indicates a condition or finding is present when it is not. Talk to your health care provider to discuss your results, treatment options, and if necessary, the need for more tests. It is your responsibility to obtain your test results. Ask the lab or department performing the test when and  how you will get your results. Talk with your health care provider if you have any questions about your results. Talk with your health care provider to discuss your results, treatment options, and if necessary, the need for more tests. Talk with your health care provider if you have any questions about your results. This information is not intended to replace advice given to you by your health care provider. Make sure you discuss any questions you have with your health  care provider. Document Released: 11/17/2004 Document Revised: 10/11/2015 Document Reviewed: 06/03/2013 Elsevier Interactive Patient Education  2017 [employer]

## 2016-03-11 NOTE — Progress Notes (Signed)
Taylor Davidson, is a 36 y.o. male  WUJ:[id number]  [id number]  DOB - 03-Dec-1980  Subjective:  Chief Complaint and HPI: Taylor Davidson is a 36 y.o. male here today to establish care and for a follow up visit after being seen and treated in the ED for CAP on 02/15/2016.  He has been seen in the ED 2 additional times since then for weakness and dizziness.  He is definitely feeling better than he was but still not back to normal.  He has still had a few episodes of Nausea and vomiting this week.  Some cramping in his mid-epigastric area.  He also c/o headaches which he has had his whole life.  He is having positional dizziness.  Cough is mostly resolved.  He denies CP/SOB.  His ears feel congested.  No vision changes.     ED/Hospital notes/EKG/Labs CXR readings reviewed.     ROS:   Constitutional:  No f/c, No night sweats, No unexplained weight loss. EENT:  No vision changes, No blurry vision, No hearing changes. +sinus and ear congestion  Respiratory: minimal to no cough, No SOB Cardiac: No CP, no palpitations GI:  Mild intermittent abd pain, No N/V/D. GU: No Urinary s/sx Musculoskeletal: No joint pain Neuro: + headache, +dizziness, no motor weakness.  Skin: No rash Endocrine:  No polydipsia. No polyuria.  Psych: Denies SI/HI  No problems updated.  ALLERGIES: No Known Allergies  PAST MEDICAL HISTORY: Past Medical History:  Diagnosis Date  . Lyme disease     MEDICATIONS AT HOME: Prior to Admission medications   Medication Sig Start Date End Date Taking? Authorizing Provider  acetaminophen (TYLENOL) 500 MG tablet Take 1 tablet (500 mg total) by mouth every 6 (six) hours as needed. Patient not taking: Reported on 03/11/2016 02/16/16   Everlene FarrierWilliam Dansie, PA-C  fluticasone [facility](FLONASE) 50 MCG/ACT nasal spray Place 2 sprays into both nostrils daily. 03/11/16   Anders SimmondsAngela M McClung, PA-C  guaiFENesin (ROBITUSSIN) 100 MG/5ML liquid Take 10 mLs (200 mg total) by mouth every 4 (four) hours  as needed for congestion. Patient not taking: Reported on 03/11/2016 05/09/15   Fayrene HelperBowie Tran, PA-C  ondansetron (ZOFRAN ODT) 4 MG disintegrating tablet 4mg  ODT q4 hours prn nausea/vomit or dizziness 03/11/16   Anders SimmondsAngela M McClung, PA-C  promethazine-dextromethorphan (PROMETHAZINE-DM) 6.25-15 MG/5ML syrup Take 5 mLs by mouth 4 (four) times daily as needed. Patient not taking: Reported on 03/11/2016 05/09/15   Fayrene HelperBowie Tran, PA-C     Objective:  EXAM:   Vitals:   03/11/16 1043  BP: 112/76  Pulse: 90  Resp: 16  Temp: 97.6 F (36.4 C)  TempSrc: Oral  SpO2: 98%  Weight: 204 lb 3.2 oz (92.6 kg)    General appearance : A&OX3. NAD. Non-toxic-appearing HEENT: Atraumatic and Normocephalic.  PERRLA. EOM intact.  TM B bulging with fluid L>R. Mouth-MMM, post pharynx WNL w/o erythema, No PND. Neck: supple, no JVD. No cervical lymphadenopathy. No thyromegaly Chest/Lungs:  Breathing-non-labored, Good air entry bilaterally, breath sounds normal without rales, rhonchi, or wheezing  CVS: S1 S2 regular, no murmurs, gallops, rubs  Abdomen: Bowel sounds present, Non tender and not distended with no gaurding, rigidity or rebound. Extremities: Bilateral Lower Ext shows no edema, both legs are warm to touch with = pulse throughout Neurology:  CN II-XII grossly intact, Non focal.  Cerebellar function, finger to nose, heel to shin all normal Psych:  TP linear. J/I WNL. Normal speech. Appropriate eye contact and affect.  Skin:  No Rash  Data Review  No results found for: HGBA1C   Assessment & Plan   1. Nausea and vomiting-mild intermittent-not severe-s/p CAP a few weeks ago - ondansetron (ZOFRAN ODT) 4 MG disintegrating tablet; 4mg  ODT q4 hours prn nausea/vomit or dizziness  Dispense: 30 tablet; Refill: 0 - H. pylori breath test  2. Dysfunction of left eustachian tube - fluticasone (FLONASE) 50 MCG/ACT nasal spray; Place 2 sprays into both nostrils daily.  Dispense: 16 g; Refill: 3  3. Dizziness and  giddiness Hydration imperative - TSH  4. Fatigue, unspecified type - TSH - Vitamin D, 25-hydroxy  5. Abnormal CXR (chest x-ray)with RUL infiltrate that resolved after Zpack - TB Skin Test  Patient have been counseled extensively about nutrition and exercise  Return in about 3 weeks (around 04/01/2016) for assign PCP and f/up fatigue/illness.  The patient was given clear instructions to go to ER or return to medical center if symptoms don't improve, worsen or new problems develop. The patient verbalized understanding. The patient was told to call to get lab results if they haven't heard anything in the next week.     Georgian Co, PA-C [facility] [place], [place] [phone]   03/11/2016, 11:19 AM

## 2016-03-12 LAB — VITAMIN D 25 HYDROXY (VIT D DEFICIENCY, FRACTURES): Vit D, 25-Hydroxy: 20 ng/mL — ABNORMAL LOW (ref 30–100)

## 2016-03-14 ENCOUNTER — Ambulatory Visit: Payer: Self-pay

## 2016-03-14 ENCOUNTER — Ambulatory Visit: Payer: Self-pay | Admitting: *Deleted

## 2016-03-14 ENCOUNTER — Telehealth: Payer: Self-pay | Admitting: *Deleted

## 2016-03-14 ENCOUNTER — Other Ambulatory Visit: Payer: Self-pay | Admitting: Physician Assistant

## 2016-03-14 DIAGNOSIS — E559 Vitamin D deficiency, unspecified: Secondary | ICD-10-CM

## 2016-03-14 LAB — H. PYLORI BREATH TEST: H. pylori Breath Test: NOT DETECTED

## 2016-03-14 MED ORDER — VITAMIN D (ERGOCALCIFEROL) 1.25 MG (50000 UNIT) PO CAPS: 50000.0000 [IU] | ORAL_CAPSULE | ORAL | 0 refills | Status: AC

## 2016-03-14 MED FILL — VIT D2 1.25 MG (50,000 UNIT: 1.25 MG | 28 days supply | Qty: 4 | Fill #0

## 2016-03-14 NOTE — Telephone Encounter (Addendum)
Pt wants to know what to do about Vitamin D level if anything. Please advise.

## 2016-03-14 NOTE — Telephone Encounter (Signed)
PPD Reading Note PPD read and results entered in [employer]. Result: no mm induration noted to Left forearm Interpretation: Negative If test not read within 48-72 hours of initial placement, patient advised to repeat in other arm 1-3 weeks after this test. Allergic reaction: no

## 2016-03-16 ENCOUNTER — Telehealth: Payer: Self-pay

## 2016-03-16 NOTE — Telephone Encounter (Signed)
Contacted pt to go over lab results pt is aware of results and doesn't have any questions or concerns 

## 2016-03-25 ENCOUNTER — Ambulatory Visit: Payer: Self-pay | Admitting: Family Medicine

## 2016-04-08 ENCOUNTER — Ambulatory Visit: Payer: Self-pay | Admitting: Family Medicine

## 2016-04-12 IMAGING — CR DG CHEST 2V
2 series · 2 of 2 positions shown · non-contrast
Comparison: 03/30/2015 chest radiograph.

CLINICAL DATA: Cough.  Fever.

EXAM:
CHEST  2 VIEW

[w chest pa]
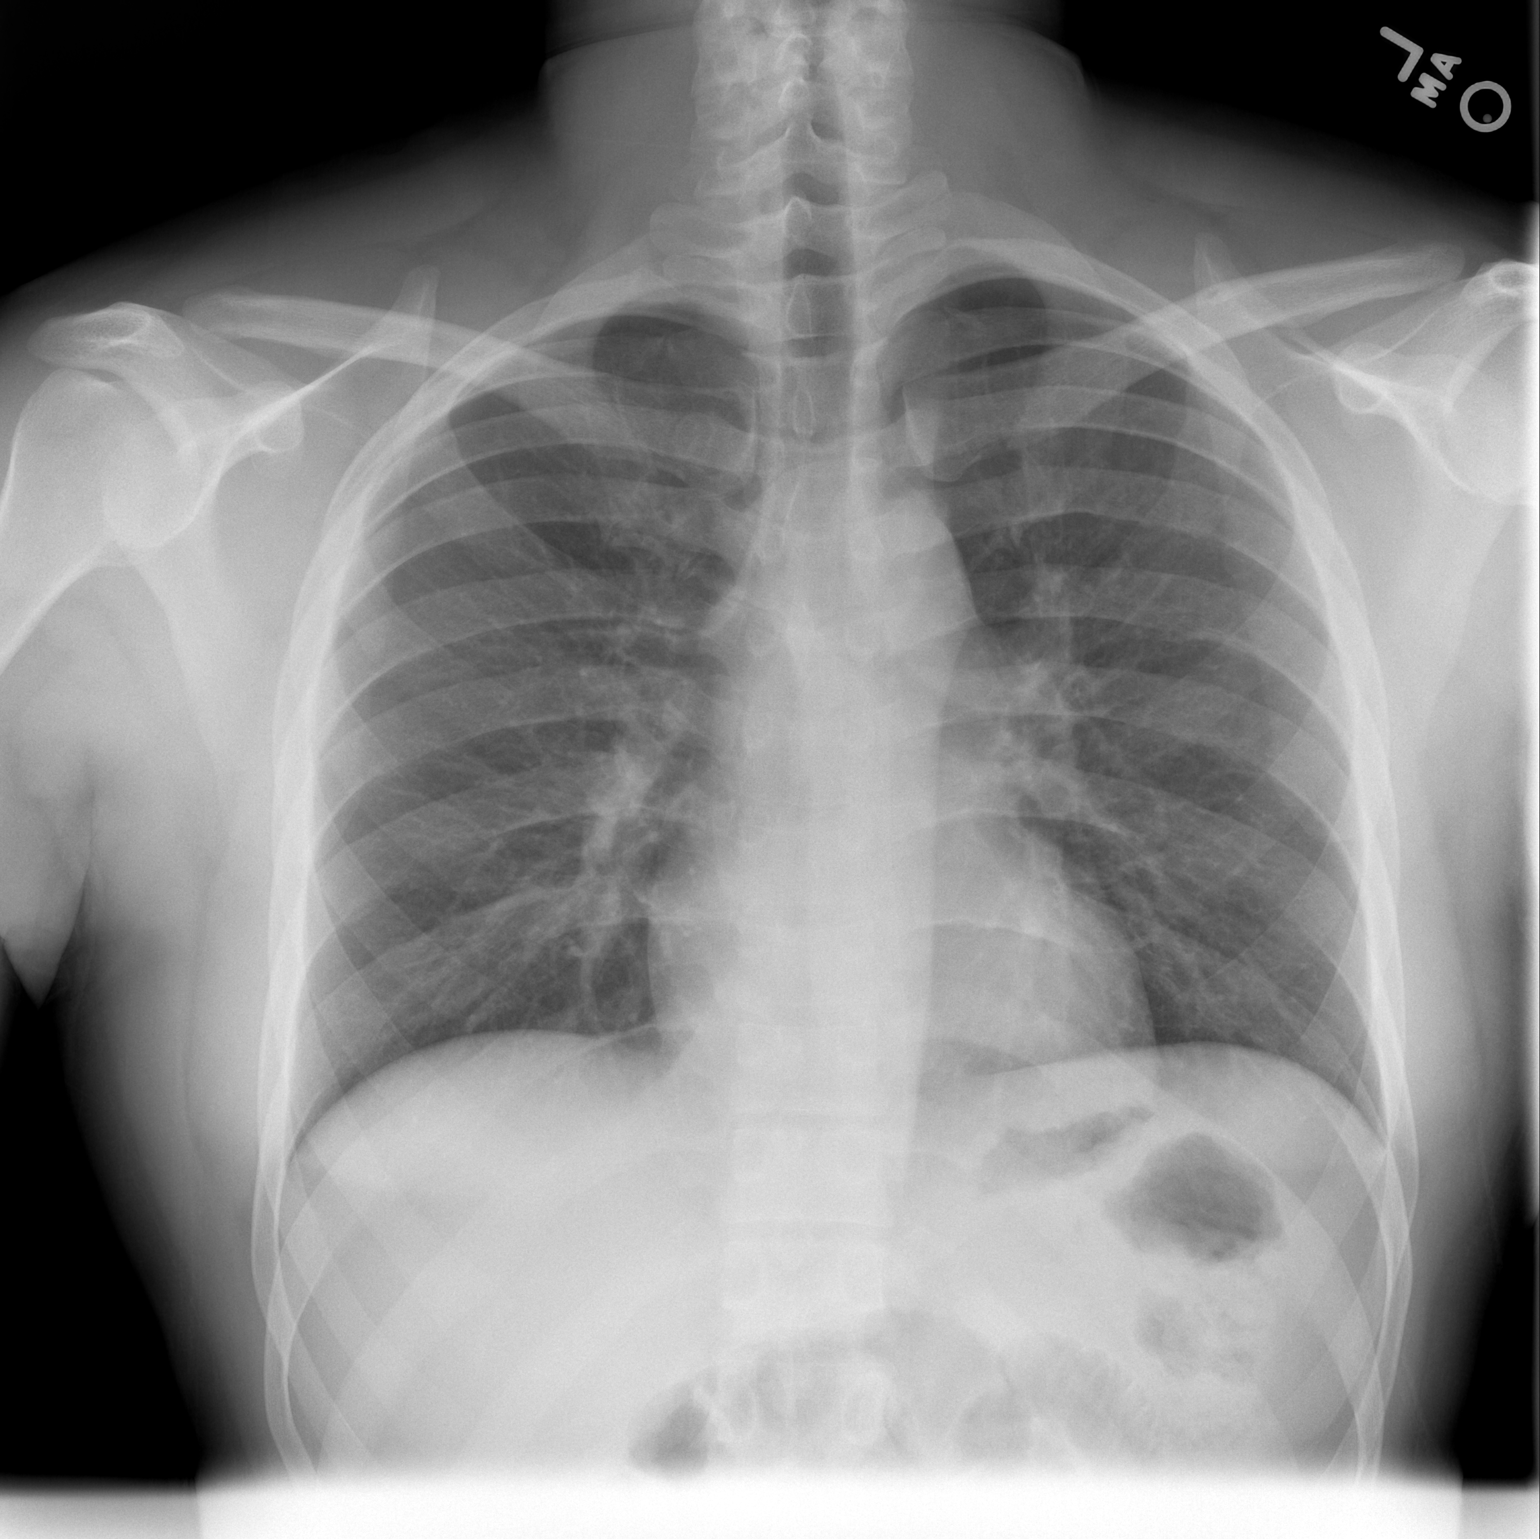

[w chest lat]
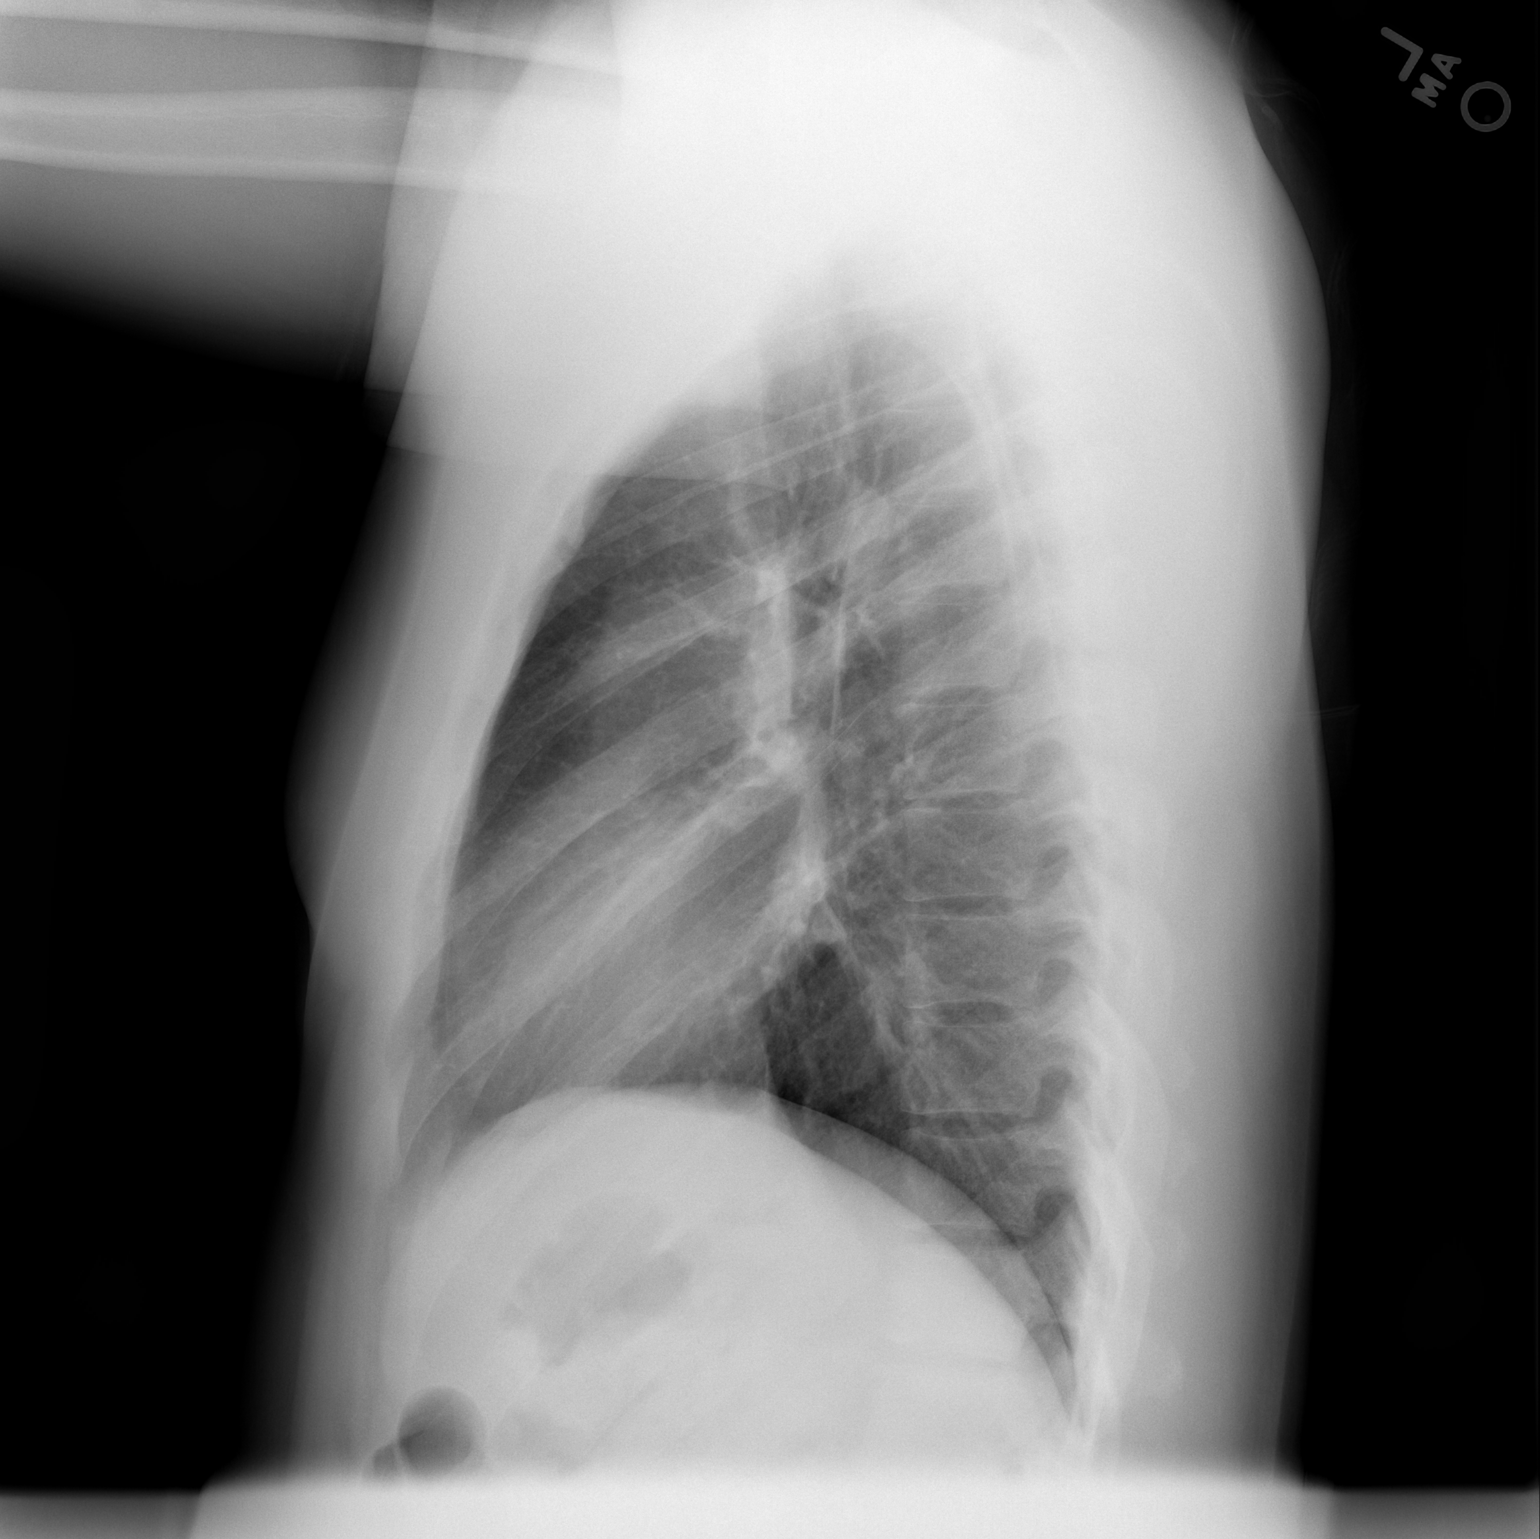

[2 of 2 positions shown; findings below may reference images not displayed]

FINDINGS: Stable cardiomediastinal silhouette with normal heart size. No
pneumothorax. No pleural effusion. Lungs appear clear, with no acute
consolidative airspace disease and no pulmonary edema.
IMPRESSION: No active cardiopulmonary disease.

## 2017-02-05 IMAGING — CR DG CHEST 2V
2 series · 2 of 2 positions shown · non-contrast
Comparison: February 15, 2016

CLINICAL DATA: Cough and congestion.  Recent pneumonia.

EXAM:
CHEST  2 VIEW

[w chest pa]
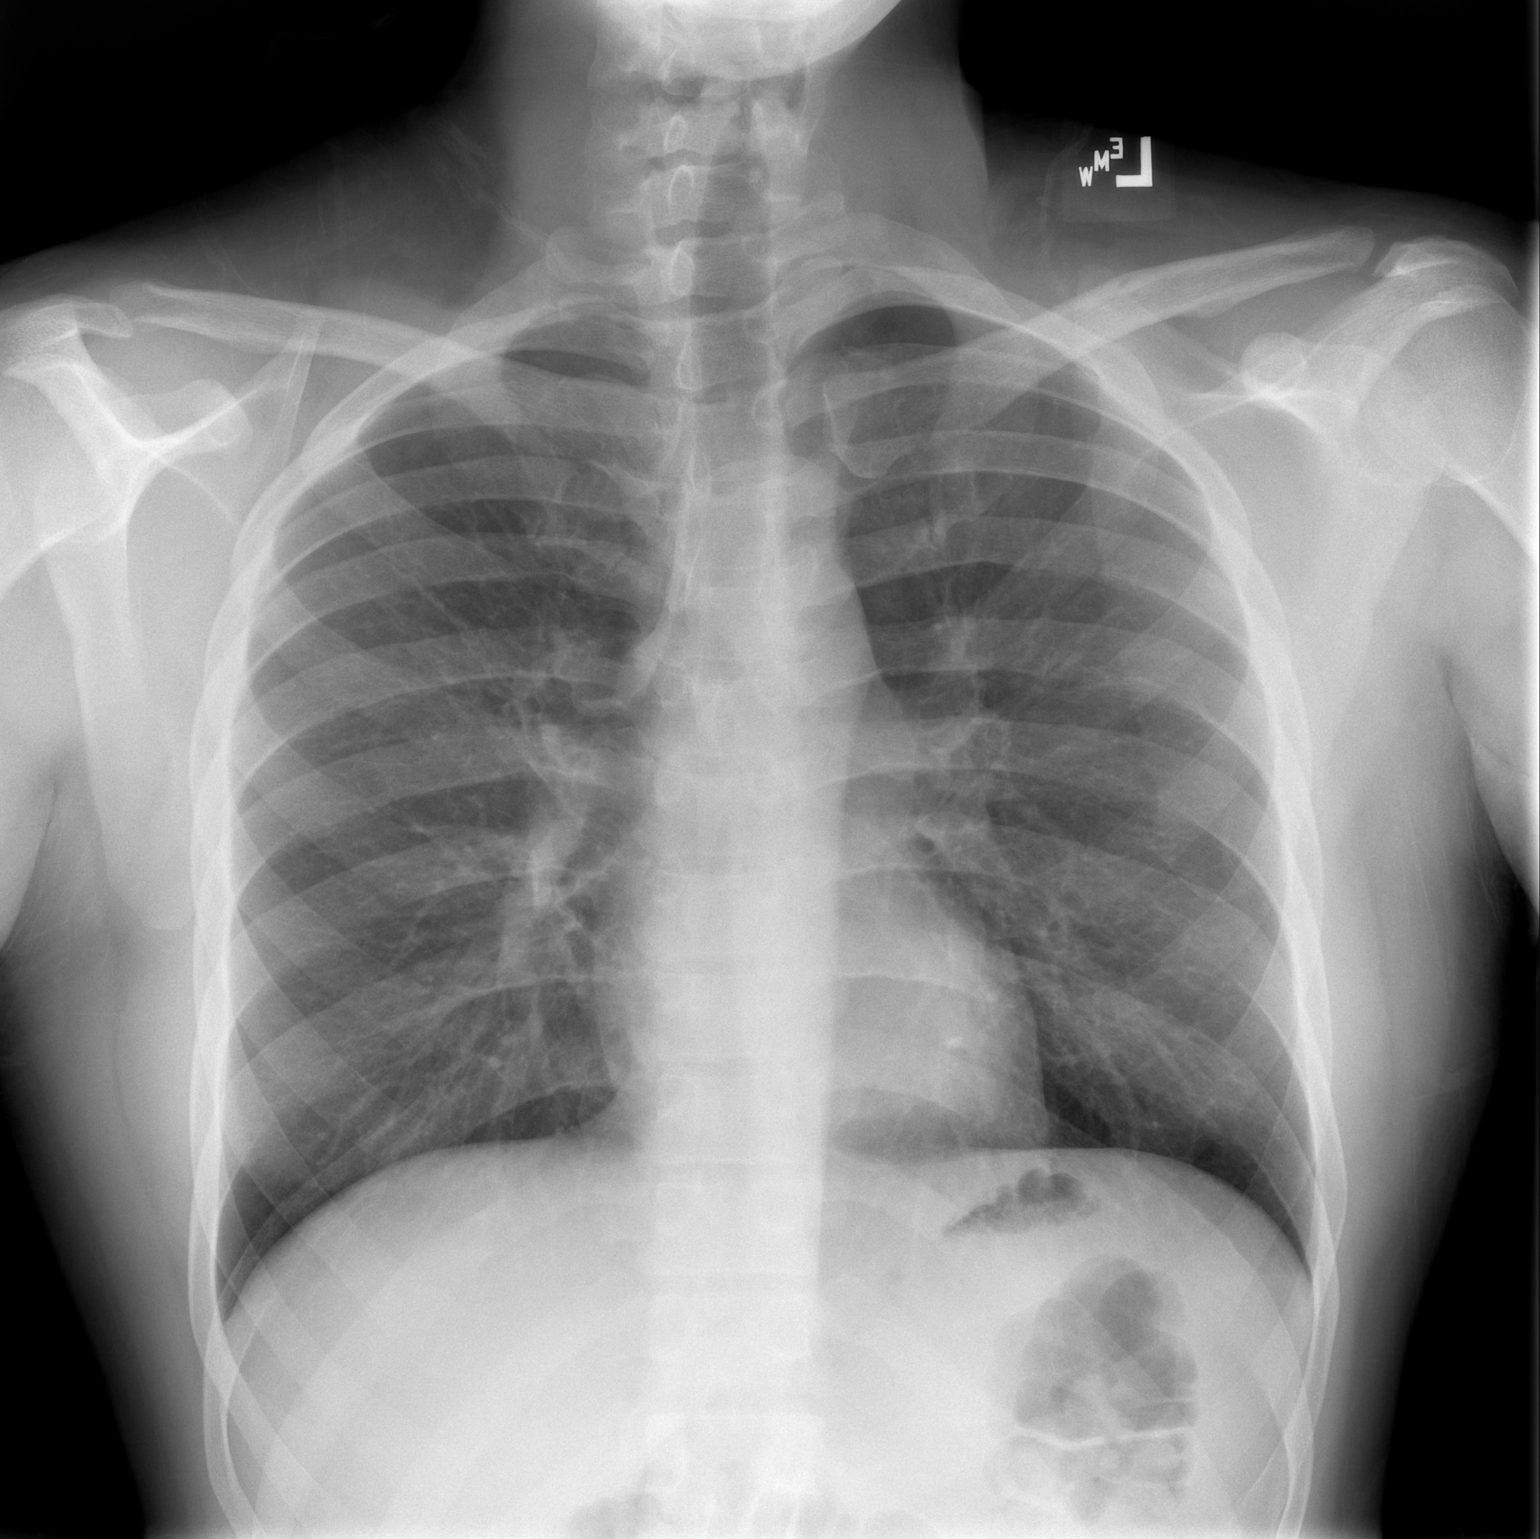

[w chest lat]
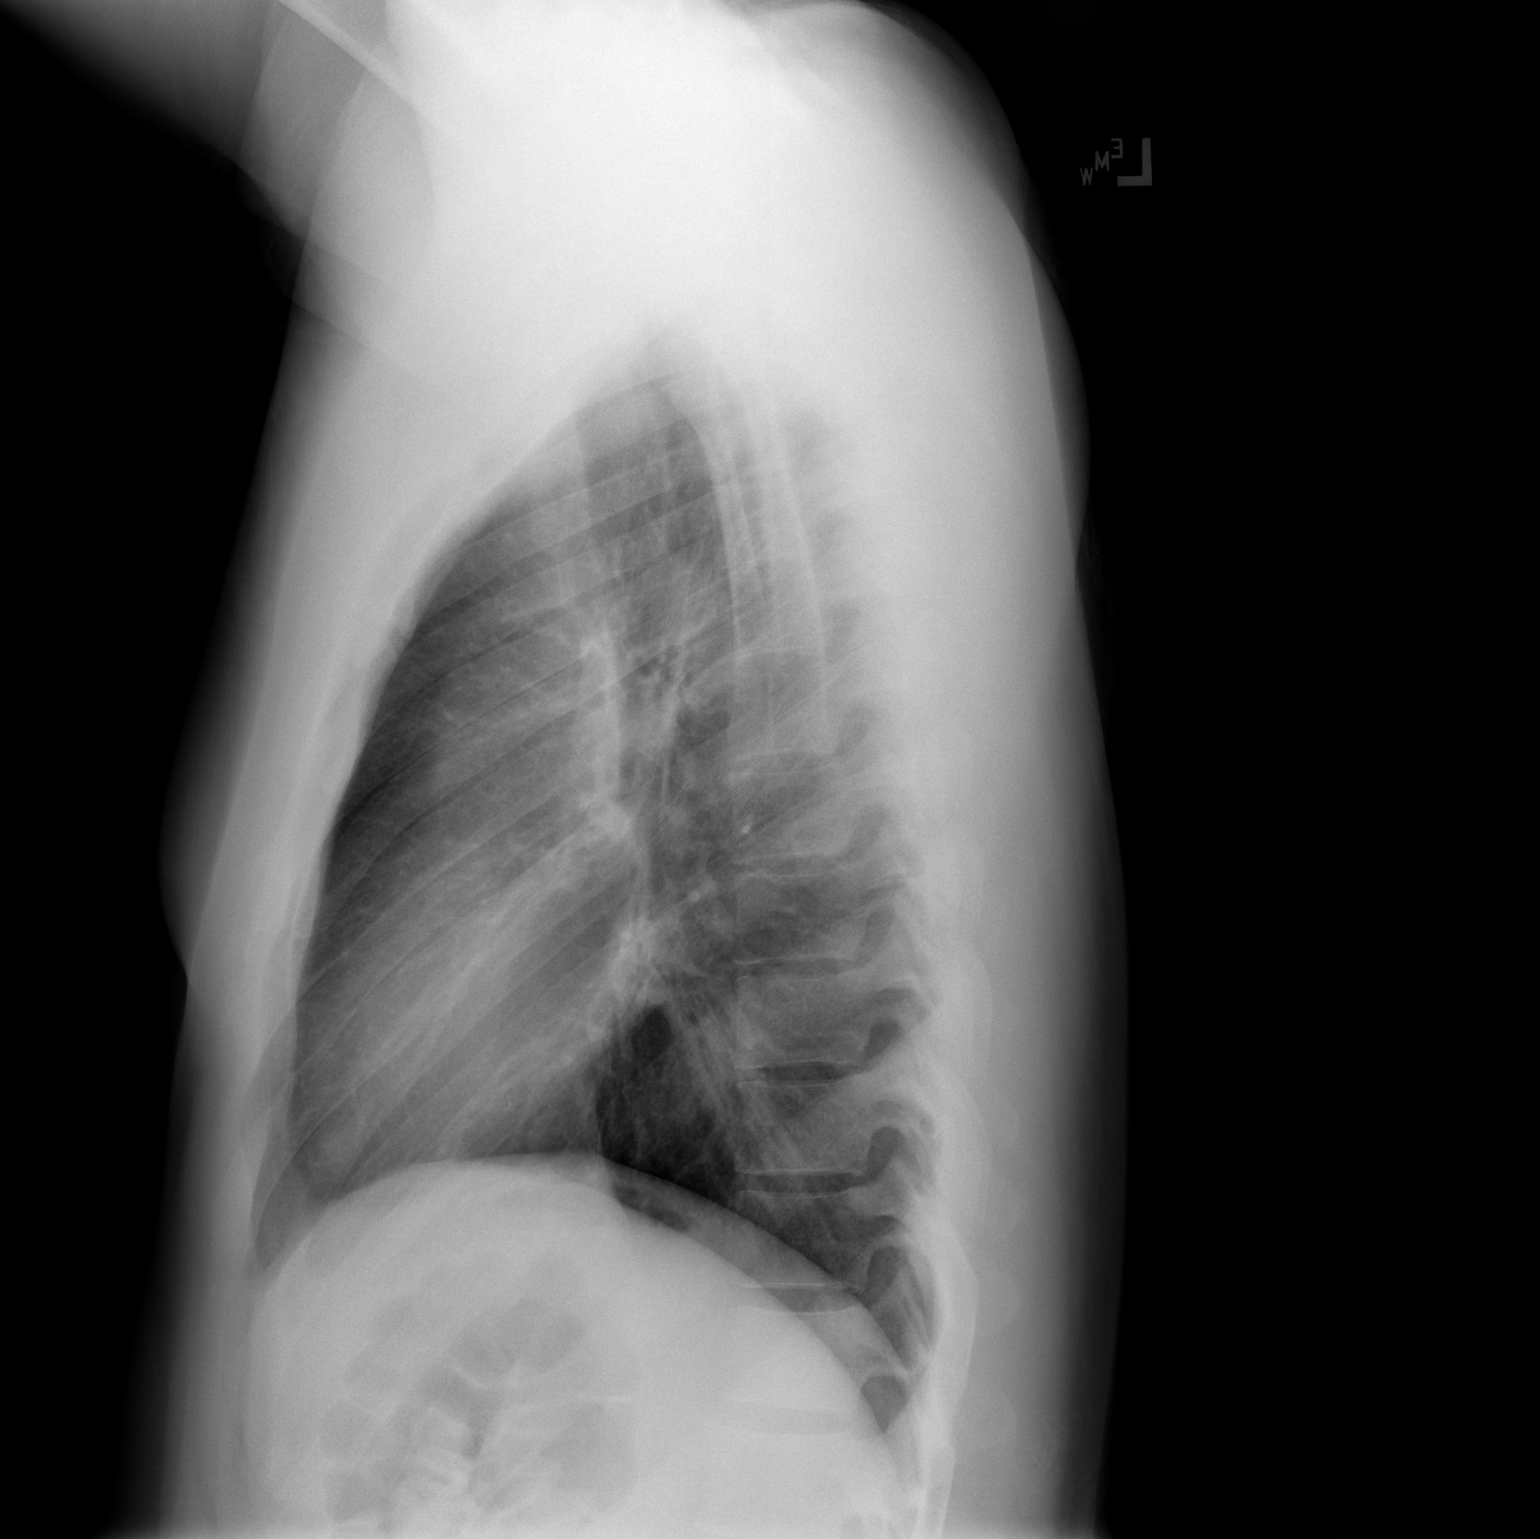

[2 of 2 positions shown; findings below may reference images not displayed]

FINDINGS: Previous infiltrate on the right has cleared. Currently lungs are
clear. Heart size and pulmonary vascularity are normal. No
adenopathy. No bone lesions.
IMPRESSION: Lungs now clear.

## 2018-09-12 ENCOUNTER — Emergency Department (HOSPITAL_COMMUNITY)
Admission: EM | Admit: 2018-09-12 | Discharge: 2018-09-12 | Disposition: A | Discharge status: Home / Self Care | Payer: Self-pay | Attending: Emergency Medicine | Admitting: Emergency Medicine

## 2018-09-12 ENCOUNTER — Encounter (HOSPITAL_COMMUNITY): Payer: Self-pay

## 2018-09-12 DIAGNOSIS — X503XXA Overexertion from repetitive movements, initial encounter: Secondary | ICD-10-CM | POA: Insufficient documentation

## 2018-09-12 DIAGNOSIS — Y929 Unspecified place or not applicable: Secondary | ICD-10-CM | POA: Insufficient documentation

## 2018-09-12 DIAGNOSIS — Y9302 Activity, running: Secondary | ICD-10-CM | POA: Insufficient documentation

## 2018-09-12 DIAGNOSIS — S76012A Strain of muscle, fascia and tendon of left hip, initial encounter: Secondary | ICD-10-CM | POA: Insufficient documentation

## 2018-09-12 DIAGNOSIS — Y999 Unspecified external cause status: Secondary | ICD-10-CM | POA: Insufficient documentation

## 2018-09-12 MED ORDER — KETOROLAC TROMETHAMINE 60 MG/2ML IM SOLN
60.0000 mg | Freq: Once | INTRAMUSCULAR | Status: AC
Start: 2018-09-12 — End: 2018-09-12
  Administered 2018-09-12: 22:00:00 60 mg via INTRAMUSCULAR
  Filled 2018-09-12: qty 2

## 2018-09-12 MED ORDER — METHOCARBAMOL 500 MG PO TABS
500.0000 mg | ORAL_TABLET | Freq: Once | ORAL | Status: AC
  Administered 2018-09-12: 500 mg via ORAL
  Filled 2018-09-12: qty 1

## 2018-09-12 MED ORDER — KETOROLAC TROMETHAMINE 10 MG PO TABS: 10.0000 mg | ORAL_TABLET | Freq: Three times a day (TID) | ORAL | 0 refills | Status: DC | PRN

## 2018-09-12 MED ORDER — METHOCARBAMOL 500 MG PO TABS: 500.0000 mg | ORAL_TABLET | Freq: Two times a day (BID) | ORAL | 0 refills | Status: DC | PRN

## 2018-09-12 NOTE — Discharge Instructions (Signed)
Alternate ice and heat to areas of injury 3-4 times per day to limit inflammation and spasm.  Avoid strenuous activity and heavy lifting.  Continue with frequent stretching. You may find benefit from foam rolling. Use crutches as needed to prevent from putting weight on your left leg.  We recommend consistent use of Toradol in addition to Robaxin for muscle spasms.  Do not drive or drink alcohol after taking Robaxin as it may make you drowsy and impair your judgment.  We recommend follow-up with a primary care doctor to ensure resolution of symptoms.  Return to the ED for any new or concerning symptoms.

## 2018-09-12 NOTE — ED Provider Notes (Signed)
[place] EMERGENCY DEPARTMENT Provider Note   CSN: [id number] Arrival date & time: 09/12/18  2004     History   Chief Complaint Chief Complaint  Patient presents with  . Muscle Pain  . Sciatica    HPI Sameul Tagle is a 38 y.o. male.     38 year old male presents to the emergency department for evaluation of left hip and thigh pain.  Pain is been constant since yesterday after he returned from a run.  He had just started getting back into running during the quarantine.  He denies any trip, stumble, fall, or other change that may have incited onset of his discomfort.  Pain is aggravated with weightbearing and has been unrelieved with the use of ibuprofen.  He has also tried stretching his thigh without relief.  Denies any back pain, extremity numbness or paresthesias, extremity weakness, bowel or bladder incontinence, fevers.  No history of prior hip or thigh injury.  The history is provided by the patient. No language interpreter was used.  Muscle Pain    Past Medical History:  Diagnosis Date  . Lyme disease     There are no active problems to display for this patient.   History reviewed. No pertinent surgical history.      Home Medications    Prior to Admission medications   Medication Sig Start Date End Date Taking? Authorizing Provider  acetaminophen (TYLENOL) 500 MG tablet Take 1 tablet (500 mg total) by mouth every 6 (six) hours as needed. Patient not taking: Reported on 03/11/2016 02/16/16   Waynetta Pean, PA-C  fluticasone [facility]) 50 MCG/ACT nasal spray Place 2 sprays into both nostrils daily. 03/11/16   Argentina Donovan, PA-C  guaiFENesin (ROBITUSSIN) 100 MG/5ML liquid Take 10 mLs (200 mg total) by mouth every 4 (four) hours as needed for congestion. Patient not taking: Reported on 03/11/2016 05/09/15   Domenic Moras, PA-C  ketorolac (TORADOL) 10 MG tablet Take 1 tablet (10 mg total) by mouth every 8 (eight) hours as needed for moderate  pain or severe pain. 09/12/18   Antonietta Breach, PA-C  methocarbamol (ROBAXIN) 500 MG tablet Take 1 tablet (500 mg total) by mouth every 12 (twelve) hours as needed for muscle spasms. 09/12/18   Antonietta Breach, PA-C  ondansetron (ZOFRAN ODT) 4 MG disintegrating tablet 4mg  ODT q4 hours prn nausea/vomit or dizziness 03/11/16   Argentina Donovan, PA-C  promethazine-dextromethorphan (PROMETHAZINE-DM) 6.25-15 MG/5ML syrup Take 5 mLs by mouth 4 (four) times daily as needed. Patient not taking: Reported on 03/11/2016 05/09/15   Domenic Moras, PA-C  Vitamin D, Ergocalciferol, (DRISDOL) 50000 units CAPS capsule Take 1 capsule (50,000 Units total) by mouth every 7 (seven) days. 03/14/16   Argentina Donovan, PA-C    Family History No family history on file.  Social History Social History   Tobacco Use  . Smoking status: Never Smoker  . Smokeless tobacco: Never Used  Substance Use Topics  . Alcohol use: No  . Drug use: No     Allergies   Patient has no known allergies.   Review of Systems Review of Systems Ten systems reviewed and are negative for acute change, except as noted in the HPI.    Physical Exam Updated Vital Signs BP 119/89   Pulse 87   Temp 99 F (37.2 C)   Resp 18   SpO2 100%   Physical Exam Vitals signs and nursing note reviewed.  Constitutional:      General: He is not in  acute distress.    Appearance: He is well-developed. He is not diaphoretic.     Comments: Nontoxic appearing and in NAD  HENT:     Head: Normocephalic and atraumatic.  Eyes:     General: No scleral icterus.    Conjunctiva/sclera: Conjunctivae normal.  Neck:     Musculoskeletal: Normal range of motion.  Cardiovascular:     Rate and Rhythm: Normal rate and regular rhythm.     Pulses: Normal pulses.     Comments: DP pulse 2+ in the LLE Pulmonary:     Effort: Pulmonary effort is normal. No respiratory distress.  Musculoskeletal: Normal range of motion.     Left hip: He exhibits tenderness. He exhibits  normal range of motion, no crepitus and no deformity.     Lumbar back: Normal.       Legs:     Comments: TTP to the left lateral hip, exacerbated most with internal rotation of the hip joint. No bony deformity or crepitus. No leg shortening or malrotation.  Skin:    General: Skin is warm and dry.     Coloration: Skin is not pale.     Findings: No erythema or rash.  Neurological:     General: No focal deficit present.     Mental Status: He is alert and oriented to person, place, and time.     Coordination: Coordination normal.     Comments: Sensation to light touch intact in the LLE. Normal dorsiflexion and plantar flexion against resistance.  Psychiatric:        Behavior: Behavior normal.      ED Treatments / Results  Labs (all labs ordered are listed, but only abnormal results are displayed) Labs Reviewed - No data to display  EKG None  Radiology No results found.  Procedures Procedures (including critical care time)  Medications Ordered in ED Medications  ketorolac (TORADOL) injection 60 mg (has no administration in time range)  methocarbamol (ROBAXIN) tablet 500 mg (has no administration in time range)     Initial Impression / Assessment and Plan / ED Course  I have reviewed the triage vital signs and the nursing notes.  Pertinent labs & imaging results that were available during my care of the patient were reviewed by me and considered in my medical decision making (see chart for details).        38 year old male presenting for left hip and thigh pain following a run yesterday.  His physical exam is consistent with likely injury/strain to the hip flexor.  He is neurovascularly intact on exam.  Advised on continued use of anti-inflammatories.  Will also give prescription for Robaxin.  Crutches administered for weightbearing as tolerated.  Patient to follow-up with sports medicine, as needed, for persistent pain.  Return precautions discussed and provided. Patient  discharged in stable condition with no unaddressed concerns.   Final Clinical Impressions(s) / ED Diagnoses   Final diagnoses:  Strain of flexor muscle of left hip, initial encounter    ED Discharge Orders         Ordered    methocarbamol (ROBAXIN) 500 MG tablet  Every 12 hours PRN     09/12/18 2220    ketorolac (TORADOL) 10 MG tablet  Every 8 hours PRN     09/12/18 2220           Antony MaduraHumes, Domitila Stetler, PA-C 09/12/18 2226    Melene PlanFloyd, Dan, DO 09/12/18 2236

## 2018-09-12 NOTE — ED Triage Notes (Signed)
Pt states that he was running yesterday and since has been having severe muscle pan in his L upper thigh that shoots to hip

## 2018-11-30 ENCOUNTER — Other Ambulatory Visit: Payer: Self-pay

## 2018-11-30 ENCOUNTER — Emergency Department
Admission: EM | Admit: 2018-11-30 | Discharge: 2018-11-30 | Disposition: A | Discharge status: Home / Self Care | Payer: Self-pay | Attending: Student in an Organized Health Care Education/Training Program | Admitting: Student in an Organized Health Care Education/Training Program

## 2018-11-30 ENCOUNTER — Encounter: Payer: Self-pay | Admitting: Emergency Medicine

## 2018-11-30 DIAGNOSIS — Z79899 Other long term (current) drug therapy: Secondary | ICD-10-CM | POA: Insufficient documentation

## 2018-11-30 DIAGNOSIS — R1013 Epigastric pain: Secondary | ICD-10-CM | POA: Insufficient documentation

## 2018-11-30 DIAGNOSIS — R112 Nausea with vomiting, unspecified: Secondary | ICD-10-CM | POA: Insufficient documentation

## 2018-11-30 LAB — COMPREHENSIVE METABOLIC PANEL
ALT: 22 U/L (ref 0–44)
AST: 20 U/L (ref 15–41)
Albumin: 4 g/dL (ref 3.5–5.0)
Alkaline Phosphatase: 48 U/L (ref 38–126)
Anion gap: 9 (ref 5–15)
BUN: 11 mg/dL (ref 6–20)
CO2: 28 mmol/L (ref 22–32)
Calcium: 9.1 mg/dL (ref 8.9–10.3)
Chloride: 103 mmol/L (ref 98–111)
Creatinine, Ser: 1.28 mg/dL — ABNORMAL HIGH (ref 0.61–1.24)
GFR calc Af Amer: >60 mL/min (ref >60)
GFR calc non Af Amer: >60 mL/min (ref >60)
Glucose, Bld: 109 mg/dL — ABNORMAL HIGH (ref 70–99)
Potassium: 3.5 mmol/L (ref 3.5–5.1)
Sodium: 140 mmol/L (ref 135–145)
Total Bilirubin: 0.8 mg/dL (ref 0.3–1.2)
Total Protein: 7.4 g/dL (ref 6.5–8.1)

## 2018-11-30 LAB — URINALYSIS, COMPLETE (UACMP) WITH MICROSCOPIC
Bacteria, UA: NONE SEEN
Bilirubin Urine: NEGATIVE
Glucose, UA: NEGATIVE mg/dL
Hgb urine dipstick: NEGATIVE
Ketones, ur: NEGATIVE mg/dL
Leukocytes,Ua: NEGATIVE
Nitrite: NEGATIVE
Protein, ur: NEGATIVE mg/dL
Specific Gravity, Urine: 1.026 (ref 1.005–1.030)
pH: 5 (ref 5.0–8.0)

## 2018-11-30 LAB — CBC
HCT: 41.8 % (ref 39.0–52.0)
Hemoglobin: 13.6 g/dL (ref 13.0–17.0)
MCH: 28.4 pg (ref 26.0–34.0)
MCHC: 32.5 g/dL (ref 30.0–36.0)
MCV: 87.3 fL (ref 80.0–100.0)
Platelets: 199 10*3/uL (ref 150–400)
RBC: 4.79 MIL/uL (ref 4.22–5.81)
RDW: 13.3 % (ref 11.5–15.5)
WBC: 5.7 10*3/uL (ref 4.0–10.5)
nRBC: 0 % (ref 0.0–0.2)

## 2018-11-30 LAB — LIPASE, BLOOD: Lipase: 29 U/L (ref 11–51)

## 2018-11-30 MED ORDER — OMEPRAZOLE 40 MG PO CPDR: 40.0000 mg | DELAYED_RELEASE_CAPSULE | Freq: Every day | ORAL | 0 refills | Status: DC

## 2018-11-30 MED ORDER — ONDANSETRON 4 MG PO TBDP
4.0000 mg | ORAL_TABLET | Freq: Once | ORAL | Status: DC
  Filled 2018-11-30: qty 1

## 2018-11-30 NOTE — ED Triage Notes (Signed)
Patient reports sharp abdominal pain in middle of stomach x2 days. Reports nausea and vomiting 2 days ago but reports no episodes since then. Denies diarrhea.

## 2018-11-30 NOTE — ED Provider Notes (Signed)
[facility] Emergency Department Provider Note    First MD Initiated Contact with Patient 11/30/18 1150     (approximate)  I have reviewed the triage vital signs and the nursing notes.   HISTORY  Chief Complaint Abdominal Pain    HPI Taylor Davidson is a 38 y.o. male presents to the ER for evaluation 48 hours of epigastric discomfort.  States his symptoms started after he took ibuprofen on an empty stomach.  Is also been taking some weight building supplements.  Nonhormonal he reports.  Also taking ginkgo.  Denies any fevers.  Did have some nausea with non-bloody emesis.  Has not had any fevers.  No flank pain or back pain.   Past Medical History:  Diagnosis Date  . Lyme disease    No family history on file. History reviewed. No pertinent surgical history. There are no active problems to display for this patient.     Prior to Admission medications   Medication Sig Start Date End Date Taking? Authorizing Provider  acetaminophen (TYLENOL) 500 MG tablet Take 1 tablet (500 mg total) by mouth every 6 (six) hours as needed. Patient not taking: Reported on 03/11/2016 02/16/16   Everlene Farrieransie, William, PA-C  fluticasone [facility](FLONASE) 50 MCG/ACT nasal spray Place 2 sprays into both nostrils daily. 03/11/16   Anders SimmondsMcClung, Angela M, PA-C  guaiFENesin (ROBITUSSIN) 100 MG/5ML liquid Take 10 mLs (200 mg total) by mouth every 4 (four) hours as needed for congestion. Patient not taking: Reported on 03/11/2016 05/09/15   Fayrene Helperran, Bowie, PA-C  ketorolac (TORADOL) 10 MG tablet Take 1 tablet (10 mg total) by mouth every 8 (eight) hours as needed for moderate pain or severe pain. 09/12/18   Antony MaduraHumes, Kelly, PA-C  methocarbamol (ROBAXIN) 500 MG tablet Take 1 tablet (500 mg total) by mouth every 12 (twelve) hours as needed for muscle spasms. 09/12/18   Antony MaduraHumes, Kelly, PA-C  omeprazole (PRILOSEC) 40 MG capsule Take 1 capsule (40 mg total) by mouth daily. 11/30/18 11/30/19  Willy Eddyobinson, Ashvin Adelson, MD   ondansetron (ZOFRAN ODT) 4 MG disintegrating tablet 4mg  ODT q4 hours prn nausea/vomit or dizziness 03/11/16   Anders SimmondsMcClung, Angela M, PA-C  promethazine-dextromethorphan (PROMETHAZINE-DM) 6.25-15 MG/5ML syrup Take 5 mLs by mouth 4 (four) times daily as needed. Patient not taking: Reported on 03/11/2016 05/09/15   Fayrene Helperran, Bowie, PA-C  Vitamin D, Ergocalciferol, (DRISDOL) 50000 units CAPS capsule Take 1 capsule (50,000 Units total) by mouth every 7 (seven) days. 03/14/16   Anders SimmondsMcClung, Angela M, PA-C    Allergies Patient has no known allergies.    Social History Social History   Tobacco Use  . Smoking status: Never Smoker  . Smokeless tobacco: Never Used  Substance Use Topics  . Alcohol use: No  . Drug use: No    Review of Systems Patient denies headaches, rhinorrhea, blurry vision, numbness, shortness of breath, chest pain, edema, cough, abdominal pain, nausea, vomiting, diarrhea, dysuria, fevers, rashes or hallucinations unless otherwise stated above in HPI. ____________________________________________   PHYSICAL EXAM:  VITAL SIGNS: Vitals:   11/30/18 1100  BP: 109/70  Pulse: 82  Resp: 16  Temp: 98.6 F (37 C)  SpO2: 100%    Constitutional: Alert and oriented.  Eyes: Conjunctivae are normal.  Head: Atraumatic. Nose: No congestion/rhinnorhea. Mouth/Throat: Mucous membranes are moist.   Neck: No stridor. Painless ROM.  Cardiovascular: Normal rate, regular rhythm. Grossly normal heart sounds.  Good peripheral circulation. Respiratory: Normal respiratory effort.  No retractions. Lungs CTAB. Gastrointestinal: Soft and nontender in all four  quadrants. No distention. No abdominal bruits. No CVA tenderness. Genitourinary:  Musculoskeletal: No lower extremity tenderness nor edema.  No joint effusions. Neurologic:  Normal speech and language. No gross focal neurologic deficits are appreciated. No facial droop Skin:  Skin is warm, dry and intact. No rash noted. Psychiatric: Mood and  affect are normal. Speech and behavior are normal.  ____________________________________________   LABS (all labs ordered are listed, but only abnormal results are displayed)  Results for orders placed or performed during the hospital encounter of 11/30/18 (from the past 24 hour(s))  Lipase, blood     Status: None   Collection Time: 11/30/18 11:08 AM  Result Value Ref Range   Lipase 29 11 - 51 U/L  Comprehensive metabolic panel     Status: Abnormal   Collection Time: 11/30/18 11:08 AM  Result Value Ref Range   Sodium 140 135 - 145 mmol/L   Potassium 3.5 3.5 - 5.1 mmol/L   Chloride 103 98 - 111 mmol/L   CO2 28 22 - 32 mmol/L   Glucose, Bld 109 (H) 70 - 99 mg/dL   BUN 11 6 - 20 mg/dL   Creatinine, Ser 7.49 (H) 0.61 - 1.24 mg/dL   Calcium 9.1 8.9 - 44.9 mg/dL   Total Protein 7.4 6.5 - 8.1 g/dL   Albumin 4.0 3.5 - 5.0 g/dL   AST 20 15 - 41 U/L   ALT 22 0 - 44 U/L   Alkaline Phosphatase 48 38 - 126 U/L   Total Bilirubin 0.8 0.3 - 1.2 mg/dL   GFR calc non Af Amer >60 >60 mL/min   GFR calc Af Amer >60 >60 mL/min   Anion gap 9 5 - 15  CBC     Status: None   Collection Time: 11/30/18 11:08 AM  Result Value Ref Range   WBC 5.7 4.0 - 10.5 K/uL   RBC 4.79 4.22 - 5.81 MIL/uL   Hemoglobin 13.6 13.0 - 17.0 g/dL   HCT 67.5 91.6 - 38.4 %   MCV 87.3 80.0 - 100.0 fL   MCH 28.4 26.0 - 34.0 pg   MCHC 32.5 30.0 - 36.0 g/dL   RDW 66.5 99.3 - 57.0 %   Platelets 199 150 - 400 K/uL   nRBC 0.0 0.0 - 0.2 %  Urinalysis, Complete w Microscopic     Status: Abnormal   Collection Time: 11/30/18 11:09 AM  Result Value Ref Range   Color, Urine YELLOW (A) YELLOW   APPearance HAZY (A) CLEAR   Specific Gravity, Urine 1.026 1.005 - 1.030   pH 5.0 5.0 - 8.0   Glucose, UA NEGATIVE NEGATIVE mg/dL   Hgb urine dipstick NEGATIVE NEGATIVE   Bilirubin Urine NEGATIVE NEGATIVE   Ketones, ur NEGATIVE NEGATIVE mg/dL   Protein, ur NEGATIVE NEGATIVE mg/dL   Nitrite NEGATIVE NEGATIVE   Leukocytes,Ua NEGATIVE  NEGATIVE   RBC / HPF 0-5 0 - 5 RBC/hpf   WBC, UA 0-5 0 - 5 WBC/hpf   Bacteria, UA NONE SEEN NONE SEEN   Squamous Epithelial / LPF 0-5 0 - 5   Mucus PRESENT    Hyaline Casts, UA PRESENT    ____________________________________________ ____________________________________________   PROCEDURES  Procedure(s) performed:  Procedures    Critical Care performed: no ____________________________________________   INITIAL IMPRESSION / ASSESSMENT AND PLAN / ED COURSE  Pertinent labs & imaging results that were available during my care of the patient were reviewed by me and considered in my medical decision making (see chart for details).  DDX: Gastritis, PUD, obstruction, enteritis, colitis, biliary pathology, pancreatitis  Taylor Davidson is a 38 y.o. who presents to the ED with symptoms as described above.  Patient well-appearing afebrile hemodynamically stable.  Benign abdominal exam.  Blood work is reassuring.  Presentation consistent with a gastritis.  Do feel patient is appropriate for trial of outpatient management.  Will give an acid prescription and referral to PCP.  Discussed signs and symptoms for which he should return to the ER.     The patient was evaluated in Emergency Department today for the symptoms described in the history of present illness. He/she was evaluated in the context of the global COVID-19 pandemic, which necessitated consideration that the patient might be at risk for infection with the SARS-CoV-2 virus that causes COVID-19. Institutional protocols and algorithms that pertain to the evaluation of patients at risk for COVID-19 are in a state of rapid change based on information released by regulatory bodies including the CDC and federal and state organizations. These policies and algorithms were followed during the patient's care in the ED.  As part of my medical decision making, I reviewed the following data within the [place] notes  reviewed and incorporated, Labs reviewed, notes from prior ED visits and [place] Controlled Substance Database   ____________________________________________   FINAL CLINICAL IMPRESSION(S) / ED DIAGNOSES  Final diagnoses:  Epigastric pain      NEW MEDICATIONS STARTED DURING THIS VISIT:  New Prescriptions   OMEPRAZOLE (PRILOSEC) 40 MG CAPSULE    Take 1 capsule (40 mg total) by mouth daily.     Note:  This document was prepared using Dragon voice recognition software and may include unintentional dictation errors.    Merlyn Lot, MD 11/30/18 1227

## 2018-11-30 NOTE — ED Notes (Signed)
MD at bedside. 

## 2018-11-30 NOTE — Discharge Instructions (Signed)

## 2019-07-22 ENCOUNTER — Other Ambulatory Visit: Payer: Self-pay

## 2019-07-22 ENCOUNTER — Encounter (HOSPITAL_COMMUNITY): Payer: Self-pay

## 2019-07-22 ENCOUNTER — Emergency Department (HOSPITAL_COMMUNITY)
Admission: EM | Admit: 2019-07-22 | Discharge: 2019-07-22 | Disposition: A | Discharge status: Home / Self Care | Payer: Self-pay | Attending: Emergency Medicine | Admitting: Emergency Medicine

## 2019-07-22 DIAGNOSIS — Z79899 Other long term (current) drug therapy: Secondary | ICD-10-CM | POA: Insufficient documentation

## 2019-07-22 DIAGNOSIS — M7989 Other specified soft tissue disorders: Secondary | ICD-10-CM | POA: Insufficient documentation

## 2019-07-22 NOTE — ED Triage Notes (Signed)
Patient complains of bilateral foot swelling with calf tightness in both legs x 2-3 days. States that this started after [place] days ago. No SOB, nad

## 2019-07-22 NOTE — Discharge Instructions (Signed)
Elevate your extremities when you are not walking on them. Avoid salty foods.  See attached information.  Use compression stockings to help get rid of residual fluid when you are out and about.  You can purchase these at [facility] or other pharmacies.  Follow-up with [place] and wellness to establish primary care services.  Call and tell them you were referred from the emergency department.  Return to the emergency department if any concerning signs or symptoms develop such as fevers, severe swelling, shortness of breath or chest pains, pain with deep breathing, redness or warmth of the extremities.

## 2019-07-22 NOTE — ED Provider Notes (Signed)
[place] EMERGENCY DEPARTMENT Provider Note   CSN: [id number] Arrival date & time: 07/22/19  1205     History Chief Complaint  Patient presents with  . bilateral foot swelling    Taylor Davidson is a 39 y.o. male presents today for evaluation of acute onset, improving bilateral lower extremity edema for 2 days.  He states that symptoms began after going on a long hike 3 days ago.  He states that he is a very active individual, on his feet all day as an Therapist, sports.  States that initially he noticed some swelling in his left foot and then developed some in his right foot.  He had some extension of edema into the ankles.  Reports that symptoms have been improving yesterday and today.  He denies pain to the lower extremities, recent travel or surgeries, hemoptysis, prior history of DVT or PE, or hormone replacement therapy.  He states that he is about to start taking testosterone supplements as he and his wife are trying to conceive.  Denies significant shortness of breath or chest pains, fevers.  He has not tried anything for his symptoms.  He is not on any chronic medications.  He is a non-smoker.  The history is provided by the patient.       Past Medical History:  Diagnosis Date  . Lyme disease     There are no problems to display for this patient.   History reviewed. No pertinent surgical history.     No family history on file.  Social History   Tobacco Use  . Smoking status: Never Smoker  . Smokeless tobacco: Never Used  Substance Use Topics  . Alcohol use: No  . Drug use: No    Home Medications Prior to Admission medications   Medication Sig Start Date End Date Taking? Authorizing Provider  acetaminophen (TYLENOL) 500 MG tablet Take 1 tablet (500 mg total) by mouth every 6 (six) hours as needed. Patient not taking: Reported on 03/11/2016 02/16/16   Waynetta Pean, PA-C  fluticasone [facility]) 50 MCG/ACT nasal spray Place 2 sprays into both  nostrils daily. 03/11/16   Argentina Donovan, PA-C  guaiFENesin (ROBITUSSIN) 100 MG/5ML liquid Take 10 mLs (200 mg total) by mouth every 4 (four) hours as needed for congestion. Patient not taking: Reported on 03/11/2016 05/09/15   Domenic Moras, PA-C  ketorolac (TORADOL) 10 MG tablet Take 1 tablet (10 mg total) by mouth every 8 (eight) hours as needed for moderate pain or severe pain. 09/12/18   Antonietta Breach, PA-C  methocarbamol (ROBAXIN) 500 MG tablet Take 1 tablet (500 mg total) by mouth every 12 (twelve) hours as needed for muscle spasms. 09/12/18   Antonietta Breach, PA-C  omeprazole (PRILOSEC) 40 MG capsule Take 1 capsule (40 mg total) by mouth daily. 11/30/18 11/30/19  Merlyn Lot, MD  ondansetron (ZOFRAN ODT) 4 MG disintegrating tablet 4mg  ODT q4 hours prn nausea/vomit or dizziness 03/11/16   Argentina Donovan, PA-C  promethazine-dextromethorphan (PROMETHAZINE-DM) 6.25-15 MG/5ML syrup Take 5 mLs by mouth 4 (four) times daily as needed. Patient not taking: Reported on 03/11/2016 05/09/15   Domenic Moras, PA-C  Vitamin D, Ergocalciferol, (DRISDOL) 50000 units CAPS capsule Take 1 capsule (50,000 Units total) by mouth every 7 (seven) days. 03/14/16   Argentina Donovan, PA-C    Allergies    Patient has no known allergies.  Review of Systems   Review of Systems  Constitutional: Negative for chills and fever.  Respiratory: Negative for shortness of  breath.   Cardiovascular: Positive for leg swelling (Improving). Negative for chest pain.  Gastrointestinal: Negative for nausea and vomiting.    Physical Exam Updated Vital Signs BP 129/73 (BP Location: Right Arm)   Pulse 83   Temp 98.7 F (37.1 C) (Oral)   Resp 18   Ht 6\' 3"  (1.905 m)   Wt 99.8 kg   SpO2 100%   BMI 27.50 kg/m   Physical Exam Vitals and nursing note reviewed.  Constitutional:      General: He is not in acute distress.    Appearance: He is well-developed.  HENT:     Head: Normocephalic and atraumatic.  Eyes:     General:          Right eye: No discharge.        Left eye: No discharge.     Conjunctiva/sclera: Conjunctivae normal.  Neck:     Vascular: No JVD.     Trachea: No tracheal deviation.  Cardiovascular:     Rate and Rhythm: Normal rate and regular rhythm.     Pulses: Normal pulses.     Heart sounds: Normal heart sounds.     Comments: 2+ radial and DP/PT pulses bilaterally, Homans sign absent bilaterally, no lower extremity edema, no palpable cords, compartments are soft  Pulmonary:     Effort: Pulmonary effort is normal. No respiratory distress.     Breath sounds: Normal breath sounds. No wheezing.     Comments: Speaking in full sentences without difficulty. Chest:     Chest wall: No tenderness.  Abdominal:     General: There is no distension.  Musculoskeletal:        General: No swelling or tenderness. Normal range of motion.     Cervical back: Normal range of motion and neck supple.     Right lower leg: No edema.     Left lower leg: No edema.  Skin:    General: Skin is warm and dry.     Capillary Refill: Capillary refill takes less than 2 seconds.     Findings: No erythema.  Neurological:     Mental Status: He is alert.     Comments: Sensation intact to light touch of bilateral lower extremities.  Psychiatric:        Behavior: Behavior normal.     ED Results / Procedures / Treatments   Labs (all labs ordered are listed, but only abnormal results are displayed) Labs Reviewed - No data to display  EKG None  Radiology No results found.  Procedures Procedures (including critical care time)  Medications Ordered in ED Medications - No data to display  ED Course  I have reviewed the triage vital signs and the nursing notes.  Pertinent labs & imaging results that were available during my care of the patient were reviewed by me and considered in my medical decision making (see chart for details).    MDM Rules/Calculators/A&P                      Patient presenting for  evaluation of bilateral foot swelling which has improved per patient's report on my assessment.  He is afebrile, vital signs are stable.  He is nontoxic in appearance.  No evidence of respiratory distress, no complaint of chest pain and lungs are clear to auscultation bilaterally.  I have no concern for heart failure or PE in this otherwise healthy young individual.  Clinically he exhibits no signs of DVT.  We did discuss  that testosterone can increase risk of clotting and to monitor for signs of DVT while he was taking this medication.  He has no pitting edema on evaluation, no pain on assessment of the feet or ankles.  He is ambulatory in the ED without difficulty and is neurovascularly intact and compartments are soft.  No evidence of secondary skin infection.  Suspect that he was experiencing some lower extremity edema due to increased activity over the last few days.  We discussed compression stockings, elevation, avoiding salty foods.  He will follow-up with [place] and wellness to establish primary care services and for reevaluation of symptoms.  Discussed strict ED return precautions. Patient verbalized understanding of and agreement with plan and is safe for discharge home at this time.  Final Clinical Impression(s) / ED Diagnoses Final diagnoses:  Foot swelling    Rx / DC Orders ED Discharge Orders    None       Jeanie Sewer, PA-C 07/22/19 1358    Mancel Bale, MD 07/22/19 1642

## 2019-07-22 NOTE — ED Notes (Signed)
Patient verbalizes understanding of discharge instructions. Opportunity for questioning and answers were provided. Armband removed by staff, pt discharged from ED.  

## 2020-04-18 ENCOUNTER — Encounter (HOSPITAL_COMMUNITY): Payer: Self-pay | Admitting: Emergency Medicine

## 2020-04-18 ENCOUNTER — Emergency Department (HOSPITAL_COMMUNITY)
Admission: EM | Admit: 2020-04-18 | Discharge: 2020-04-18 | Disposition: A | Discharge status: Home / Self Care | Payer: Self-pay | Attending: Emergency Medicine | Admitting: Emergency Medicine

## 2020-04-18 ENCOUNTER — Other Ambulatory Visit: Payer: Self-pay

## 2020-04-18 DIAGNOSIS — K649 Unspecified hemorrhoids: Secondary | ICD-10-CM

## 2020-04-18 DIAGNOSIS — K648 Other hemorrhoids: Secondary | ICD-10-CM | POA: Insufficient documentation

## 2020-04-18 MED ORDER — PRAMOXINE HCL (PERIANAL) 1 % EX FOAM: 1.0000 | Freq: Three times a day (TID) | CUTANEOUS | 0 refills | Status: DC | PRN

## 2020-04-18 NOTE — Discharge Instructions (Signed)
Please review the discharge instructions.  Use stool softeners as we discussed to help prevent hemorrhoids.  Try soaks in the tubs to help alleviate itching and irritation.  The medication can also help.  Follow-up with a primary care doctor or GI doctor if the symptoms do not resolve in the next couple of weeks.

## 2020-04-18 NOTE — ED Triage Notes (Signed)
Pt. Stated, Im having rectal bleeding on my rectum x 2 days ago., no pain just itching

## 2020-04-18 NOTE — ED Provider Notes (Signed)
[facility] EMERGENCY DEPARTMENT Provider Note   CSN: [id number] Arrival date & time: 04/18/20  1021     History Chief Complaint  Patient presents with  . Pruritis    Taylor Davidson is a 40 y.o. male.  HPI   Pt presents to the ED with complaints of anal itching.  Patient states he started noticing the symptoms a couple days ago.  He noticed that after wiping that he was having some itching and irritation.  He has not actually noticed any blood.  He also denies having any trouble with pain or fevers.  Patient states a friend of his had some issues with rectal bleeding and ended up having a hemorrhoid.  However he was told he needed to have a procedure to make sure he did not have cancer.  Patient states this is what concerned him so he wanted to make sure nothing serious was going on.  His wife did take a look at the area and they googled some pictures and thought it possibly could be a hemorrhoid.  He denies any abdominal pain.  He denies any constipation recently.  He does work as a [employer].  Past Medical History:  Diagnosis Date  . Lyme disease     There are no problems to display for this patient.   No past surgical history on file.     No family history on file.  Social History   Tobacco Use  . Smoking status: Never Smoker  . Smokeless tobacco: Never Used  Substance Use Topics  . Alcohol use: No  . Drug use: No    Home Medications Prior to Admission medications   Medication Sig Start Date End Date Taking? Authorizing Provider  pramoxine (PROCTOFOAM) 1 % foam Place 1 application rectally 3 (three) times daily as needed for anal itching. 04/18/20  Yes Linwood Dibbles, MD  acetaminophen (TYLENOL) 500 MG tablet Take 1 tablet (500 mg total) by mouth every 6 (six) hours as needed. Patient not taking: Reported on 03/11/2016 02/16/16   Everlene Farrier, PA-C  fluticasone [facility]) 50 MCG/ACT nasal spray Place 2 sprays into both nostrils daily. 03/11/16    Anders Simmonds, PA-C  guaiFENesin (ROBITUSSIN) 100 MG/5ML liquid Take 10 mLs (200 mg total) by mouth every 4 (four) hours as needed for congestion. Patient not taking: Reported on 03/11/2016 05/09/15   Fayrene Helper, PA-C  ketorolac (TORADOL) 10 MG tablet Take 1 tablet (10 mg total) by mouth every 8 (eight) hours as needed for moderate pain or severe pain. 09/12/18   Antony Madura, PA-C  methocarbamol (ROBAXIN) 500 MG tablet Take 1 tablet (500 mg total) by mouth every 12 (twelve) hours as needed for muscle spasms. 09/12/18   Antony Madura, PA-C  omeprazole (PRILOSEC) 40 MG capsule Take 1 capsule (40 mg total) by mouth daily. 11/30/18 11/30/19  Willy Eddy, MD  ondansetron (ZOFRAN ODT) 4 MG disintegrating tablet 4mg  ODT q4 hours prn nausea/vomit or dizziness 03/11/16   03/13/16, PA-C  promethazine-dextromethorphan (PROMETHAZINE-DM) 6.25-15 MG/5ML syrup Take 5 mLs by mouth 4 (four) times daily as needed. Patient not taking: Reported on 03/11/2016 05/09/15   05/11/15, PA-C  Vitamin D, Ergocalciferol, (DRISDOL) 50000 units CAPS capsule Take 1 capsule (50,000 Units total) by mouth every 7 (seven) days. 03/14/16   03/16/16, PA-C    Allergies    Patient has no known allergies.  Review of Systems   Review of Systems  Constitutional: Negative for fever.  Gastrointestinal: Negative  for anal bleeding and blood in stool.  Genitourinary: Negative for dysuria.  All other systems reviewed and are negative.   Physical Exam Updated Vital Signs BP 125/87 (BP Location: Left Arm)   Pulse 91   Temp 98.2 F (36.8 C) (Oral)   Resp 16   SpO2 95%   Physical Exam Vitals and nursing note reviewed.  Constitutional:      General: He is not in acute distress.    Appearance: He is well-developed.  HENT:     Head: Normocephalic and atraumatic.     Right Ear: External ear normal.     Left Ear: External ear normal.  Eyes:     General: No scleral icterus.       Right eye: No discharge.         Left eye: No discharge.     Conjunctiva/sclera: Conjunctivae normal.  Neck:     Trachea: No tracheal deviation.  Cardiovascular:     Rate and Rhythm: Normal rate.  Pulmonary:     Effort: Pulmonary effort is normal. No respiratory distress.     Breath sounds: No stridor.  Abdominal:     General: There is no distension.     Palpations: There is no mass.     Tenderness: There is no abdominal tenderness.  Genitourinary:    Rectum: External hemorrhoid present. No mass or tenderness.  Musculoskeletal:        General: No swelling or deformity.     Cervical back: Neck supple.  Skin:    General: Skin is warm and dry.     Findings: No rash.  Neurological:     Mental Status: He is alert.     Cranial Nerves: Cranial nerve deficit: no gross deficits.     ED Results / Procedures / Treatments   Labs (all labs ordered are listed, but only abnormal results are displayed) Labs Reviewed - No data to display  EKG None  Radiology No results found.  Procedures Procedures   Medications Ordered in ED Medications - No data to display  ED Course  I have reviewed the triage vital signs and the nursing notes.  Pertinent labs & imaging results that were available during my care of the patient were reviewed by me and considered in my medical decision making (see chart for details).    MDM Rules/Calculators/A&P                         Patient does have a small hemorrhoid on exam.  He is not having any tenderness.  There is no evidence of bleeding.  Discussed treatment of hemorrhoids with the patient.  Recommended stool softeners to help prevent constipation.  Discussed over-the-counter medications that he could use in the future.  Will prescribe a hemorrhoid cream. Final Clinical Impression(s) / ED Diagnoses Final diagnoses:  Hemorrhoids, unspecified hemorrhoid type    Rx / DC Orders ED Discharge Orders         Ordered    pramoxine (PROCTOFOAM) 1 % foam  3 times daily PRN         04/18/20 1124           Linwood Dibbles, MD 04/18/20 1128

## 2020-11-26 ENCOUNTER — Emergency Department (HOSPITAL_COMMUNITY): Payer: No Typology Code available for payment source

## 2020-11-26 ENCOUNTER — Emergency Department (HOSPITAL_COMMUNITY)
Admission: EM | Admit: 2020-11-26 | Discharge: 2020-11-26 | Disposition: A | Discharge status: Home / Self Care | Payer: No Typology Code available for payment source | Attending: Emergency Medicine | Admitting: Emergency Medicine

## 2020-11-26 ENCOUNTER — Other Ambulatory Visit: Payer: Self-pay

## 2020-11-26 ENCOUNTER — Encounter (HOSPITAL_COMMUNITY): Payer: Self-pay

## 2020-11-26 DIAGNOSIS — M542 Cervicalgia: Secondary | ICD-10-CM | POA: Diagnosis not present

## 2020-11-26 DIAGNOSIS — Y92410 Unspecified street and highway as the place of occurrence of the external cause: Secondary | ICD-10-CM | POA: Insufficient documentation

## 2020-11-26 DIAGNOSIS — Z5321 Procedure and treatment not carried out due to patient leaving prior to being seen by health care provider: Secondary | ICD-10-CM | POA: Diagnosis not present

## 2020-11-26 DIAGNOSIS — R42 Dizziness and giddiness: Secondary | ICD-10-CM | POA: Diagnosis present

## 2020-11-26 DIAGNOSIS — M545 Low back pain, unspecified: Secondary | ICD-10-CM | POA: Insufficient documentation

## 2020-11-26 DIAGNOSIS — R519 Headache, unspecified: Secondary | ICD-10-CM | POA: Insufficient documentation

## 2020-11-26 DIAGNOSIS — M549 Dorsalgia, unspecified: Secondary | ICD-10-CM

## 2020-11-26 MED ORDER — ACETAMINOPHEN 325 MG PO TABS
650.0000 mg | ORAL_TABLET | Freq: Once | ORAL | Status: AC
  Administered 2020-11-26: 650 mg via ORAL
  Filled 2020-11-26: qty 2

## 2020-11-26 NOTE — ED Notes (Signed)
Pt walked out
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
## 2021-07-05 ENCOUNTER — Other Ambulatory Visit: Payer: Self-pay

## 2021-07-05 ENCOUNTER — Encounter (HOSPITAL_COMMUNITY): Payer: Self-pay

## 2021-07-05 ENCOUNTER — Emergency Department (HOSPITAL_COMMUNITY): Payer: Self-pay

## 2021-07-05 ENCOUNTER — Emergency Department (HOSPITAL_COMMUNITY)
Admission: EM | Admit: 2021-07-05 | Discharge: 2021-07-06 | Disposition: A | Discharge status: Home / Self Care | Payer: Self-pay | Attending: Emergency Medicine | Admitting: Emergency Medicine

## 2021-07-05 DIAGNOSIS — R7989 Other specified abnormal findings of blood chemistry: Secondary | ICD-10-CM | POA: Insufficient documentation

## 2021-07-05 DIAGNOSIS — R079 Chest pain, unspecified: Secondary | ICD-10-CM | POA: Insufficient documentation

## 2021-07-05 DIAGNOSIS — M549 Dorsalgia, unspecified: Secondary | ICD-10-CM | POA: Insufficient documentation

## 2021-07-05 DIAGNOSIS — M542 Cervicalgia: Secondary | ICD-10-CM | POA: Insufficient documentation

## 2021-07-05 LAB — COMPREHENSIVE METABOLIC PANEL
ALT: 19 U/L (ref 0–44)
AST: 16 U/L (ref 15–41)
Albumin: 4 g/dL (ref 3.5–5.0)
Alkaline Phosphatase: 54 U/L (ref 38–126)
Anion gap: 5 (ref 5–15)
BUN: 15 mg/dL (ref 6–20)
CO2: 28 mmol/L (ref 22–32)
Calcium: 9.3 mg/dL (ref 8.9–10.3)
Chloride: 105 mmol/L (ref 98–111)
Creatinine, Ser: 1.45 mg/dL — ABNORMAL HIGH (ref 0.61–1.24)
GFR, Estimated: >60 mL/min (ref >60)
Glucose, Bld: 83 mg/dL (ref 70–99)
Potassium: 4.1 mmol/L (ref 3.5–5.1)
Sodium: 138 mmol/L (ref 135–145)
Total Bilirubin: 0.5 mg/dL (ref 0.3–1.2)
Total Protein: 7.7 g/dL (ref 6.5–8.1)

## 2021-07-05 LAB — CBC WITH DIFFERENTIAL/PLATELET
Abs Immature Granulocytes: 0.01 10*3/uL (ref 0.00–0.07)
Basophils Absolute: 0 10*3/uL (ref 0.0–0.1)
Basophils Relative: 1 %
Eosinophils Absolute: 0.2 10*3/uL (ref 0.0–0.5)
Eosinophils Relative: 4 %
HCT: 45.5 % (ref 39.0–52.0)
Hemoglobin: 14.8 g/dL (ref 13.0–17.0)
Immature Granulocytes: 0 %
Lymphocytes Relative: 44 %
Lymphs Abs: 2.4 10*3/uL (ref 0.7–4.0)
MCH: 28.6 pg (ref 26.0–34.0)
MCHC: 32.5 g/dL (ref 30.0–36.0)
MCV: 88 fL (ref 80.0–100.0)
Monocytes Absolute: 0.5 10*3/uL (ref 0.1–1.0)
Monocytes Relative: 9 %
Neutro Abs: 2.3 10*3/uL (ref 1.7–7.7)
Neutrophils Relative %: 42 %
Platelets: 241 10*3/uL (ref 150–400)
RBC: 5.17 MIL/uL (ref 4.22–5.81)
RDW: 13.2 % (ref 11.5–15.5)
WBC: 5.5 10*3/uL (ref 4.0–10.5)
nRBC: 0 % (ref 0.0–0.2)

## 2021-07-05 LAB — TROPONIN I (HIGH SENSITIVITY)
Troponin I (High Sensitivity): 3 ng/L (ref <18)
Troponin I (High Sensitivity): 3 ng/L (ref <18)

## 2021-07-05 LAB — D-DIMER, QUANTITATIVE: D-Dimer, Quant: <0.27 ug/mL-FEU (ref 0.00–0.50)

## 2021-07-05 MED ORDER — LIDOCAINE 5 % EX PTCH
2.0000 | MEDICATED_PATCH | CUTANEOUS | Status: DC
  Administered 2021-07-05: 2 via TRANSDERMAL
  Filled 2021-07-05: qty 2

## 2021-07-05 MED ORDER — METHOCARBAMOL 500 MG PO TABS
500.0000 mg | ORAL_TABLET | Freq: Once | ORAL | Status: AC
  Administered 2021-07-05: 500 mg via ORAL
  Filled 2021-07-05: qty 1

## 2021-07-05 MED ORDER — ACETAMINOPHEN 325 MG PO TABS
650.0000 mg | ORAL_TABLET | Freq: Once | ORAL | Status: AC
  Administered 2021-07-05: 650 mg via ORAL
  Filled 2021-07-05: qty 2

## 2021-07-05 NOTE — ED Triage Notes (Signed)
Patient complains of right sided neck and scapula and arm pain for the past few days. Pain worse with inspiration and movement. Alert and oriented ?
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
## 2021-07-05 NOTE — ED Provider Notes (Signed)
?[facility] EMERGENCY DEPARTMENT ?Provider Note ? ? ?CSN: [id number] ?Arrival date & time: 07/05/21  1548 ? ?  ? ?History ? ?Chief Complaint  ?Patient presents with  ? Neck Pain  ? Chest Pain  ? ? ?Taylor Davidson is a 41 y.o. male with a hx of lymes disease who presents to the ED with complaints of neck pain and chest pain for the past few days. Pain is primarily to the right side of his neck/scapula, but occurs in the right anterior chest as well. Pain is sharp in nature, worse with neck movement and RUE movement as well as deep breathing.  Denies specific injury or activity level change.  He denies nausea, vomiting, diaphoresis, numbness, tingling, weakness, leg pain/swelling, hemoptysis, recent surgery/trauma, recent long travel, hormone use, personal hx of cancer, or hx of DVT/PE.  ? ? ? ?HPI ? ?  ? ?Home Medications ?Prior to Admission medications   ?Medication Sig Start Date End Date Taking? Authorizing Provider  ?acetaminophen (TYLENOL) 500 MG tablet Take 1 tablet (500 mg total) by mouth every 6 (six) hours as needed. ?Patient not taking: Reported on 03/11/2016 02/16/16   Everlene Farrier, PA-C  ?fluticasone (FLONASE) 50 MCG/ACT nasal spray Place 2 sprays into both nostrils daily. 03/11/16   Anders Simmonds, PA-C  ?guaiFENesin (ROBITUSSIN) 100 MG/5ML liquid Take 10 mLs (200 mg total) by mouth every 4 (four) hours as needed for congestion. ?Patient not taking: Reported on 03/11/2016 05/09/15   Fayrene Helper, PA-C  ?ketorolac (TORADOL) 10 MG tablet Take 1 tablet (10 mg total) by mouth every 8 (eight) hours as needed for moderate pain or severe pain. 09/12/18   Antony Madura, PA-C  ?methocarbamol (ROBAXIN) 500 MG tablet Take 1 tablet (500 mg total) by mouth every 12 (twelve) hours as needed for muscle spasms. 09/12/18   Antony Madura, PA-C  ?omeprazole (PRILOSEC) 40 MG capsule Take 1 capsule (40 mg total) by mouth daily. 11/30/18 11/30/19  Willy Eddy, MD  ?ondansetron (ZOFRAN ODT) 4 MG  disintegrating tablet 4mg  ODT q4 hours prn nausea/vomit or dizziness 03/11/16   03/13/16, PA-C  ?pramoxine (PROCTOFOAM) 1 % foam Place 1 application rectally 3 (three) times daily as needed for anal itching. 04/18/20   04/20/20, MD  ?promethazine-dextromethorphan (PROMETHAZINE-DM) 6.25-15 MG/5ML syrup Take 5 mLs by mouth 4 (four) times daily as needed. ?Patient not taking: Reported on 03/11/2016 05/09/15   05/11/15, PA-C  ?Vitamin D, Ergocalciferol, (DRISDOL) 50000 units CAPS capsule Take 1 capsule (50,000 Units total) by mouth every 7 (seven) days. 03/14/16   03/16/16, PA-C  ?   ? ?Allergies    ?Patient has no known allergies.   ? ?Review of Systems   ?Review of Systems  ?Constitutional:  Negative for chills and fever.  ?Respiratory:  Negative for shortness of breath.   ?Cardiovascular:  Positive for chest pain.  ?Gastrointestinal:  Negative for abdominal pain, nausea and vomiting.  ?Musculoskeletal:  Positive for back pain and neck pain.  ?Neurological:  Negative for weakness and numbness.  ?All other systems reviewed and are negative. ? ?Physical Exam ?Updated Vital Signs ?BP (!) 124/93 (BP Location: Right Arm)   Pulse 83   Temp 98.3 ?F (36.8 ?C) (Oral)   Resp 18   SpO2 99%  ?Physical Exam ?Vitals and nursing note reviewed.  ?Constitutional:   ?   General: He is not in acute distress. ?   Appearance: Normal appearance. He is well-developed. He is not ill-appearing or toxic-appearing.  ?  HENT:  ?   Head: Normocephalic and atraumatic.  ?Eyes:  ?   General:     ?   Right eye: No discharge.     ?   Left eye: No discharge.  ?   Conjunctiva/sclera: Conjunctivae normal.  ?Neck:  ?   Comments: Intact range of motion. ?Cardiovascular:  ?   Rate and Rhythm: Normal rate and regular rhythm.  ?   Pulses:     ?     Radial pulses are 2+ on the right side and 2+ on the left side.  ?Pulmonary:  ?   Effort: No respiratory distress.  ?   Breath sounds: Normal breath sounds. No wheezing or rales.  ?Abdominal:  ?    General: There is no distension.  ?   Palpations: Abdomen is soft.  ?   Tenderness: There is no abdominal tenderness.  ?Musculoskeletal:  ?   Cervical back: Neck supple. No spinous process tenderness.  ?   Right lower leg: No tenderness. No edema.  ?   Left lower leg: No tenderness. No edema.  ?   Comments: Upper extremities: No obvious deformity, appreciable swelling, edema, erythema, ecchymosis, warmth, or open wounds. Patient has intact AROM throughout-does have some pain to right side of the neck/back with ranging of the right shoulder.  No focal bony tenderness to palpation. ?Back: No midline tenderness.  ?Skin: ?   General: Skin is warm and dry.  ?   Capillary Refill: Capillary refill takes less than 2 seconds.  ?Neurological:  ?   Mental Status: He is alert.  ?   Comments: Alert. Clear speech. Sensation grossly intact to bilateral upper extremities. 5/5 symmetric grip strength. Ambulatory.   ?Psychiatric:     ?   Mood and Affect: Mood normal.     ?   Behavior: Behavior normal.  ? ? ?ED Results / Procedures / Treatments   ?Labs ?(all labs ordered are listed, but only abnormal results are displayed) ?Labs Reviewed  ?COMPREHENSIVE METABOLIC PANEL - Abnormal; Notable for the following components:  ?    Result Value  ? Creatinine, Ser 1.45 (*)   ? All other components within normal limits  ?CBC WITH DIFFERENTIAL/PLATELET  ?TROPONIN I (HIGH SENSITIVITY)  ?TROPONIN I (HIGH SENSITIVITY)  ? ? ?EKG ?EKG Interpretation ? ?Date/Time:  Monday Jul 05 2021 22:23:17 EDT ?Ventricular Rate:  67 ?PR Interval:  152 ?QRS Duration: 101 ?QT Interval:  375 ?QTC Calculation: 396 ?R Axis:   69 ?Text Interpretation: Sinus rhythm nonspecific ST changes inferiorly Early repolarization Confirmed by Ross MarcusHorton, Courtney ([id number]) on 07/06/2021 1:51:04 AM ? ?Radiology ?DG Chest 2 View ? ?Result Date: 07/05/2021 ?CLINICAL DATA:  Right chest pain EXAM: CHEST - 2 VIEW COMPARISON:  03/03/2016 FINDINGS: The heart size and mediastinal contours are  within normal limits. Both lungs are clear. The visualized skeletal structures are unremarkable. IMPRESSION: No active cardiopulmonary disease. Electronically Signed   By: Ernie AvenaPalani  Rathinasamy M.D.   On: 07/05/2021 17:50  ? ?CT Cervical Spine Wo Contrast ? ?Result Date: 07/05/2021 ?CLINICAL DATA:  Cervical radiculopathy, no red flags. EXAM: CT CERVICAL SPINE WITHOUT CONTRAST TECHNIQUE: Multidetector CT imaging of the cervical spine was performed without intravenous contrast. Multiplanar CT image reconstructions were also generated. RADIATION DOSE REDUCTION: This exam was performed according to the departmental dose-optimization program which includes automated exposure control, adjustment of the mA and/or kV according to patient size and/or use of iterative reconstruction technique. COMPARISON:  11/26/2020 FINDINGS: Alignment: Straightening of the normal cervical lordosis.  No listhesis. Skull base and vertebrae: No acute fracture or suspicious osseous lesion. Soft tissues and spinal canal: No prevertebral fluid or swelling. No visible canal hematoma. Disc levels: Vertebral body heights are largely preserved. Mild degenerative changes, with small disc bulges at C3-C4 and C5-C6. No high-grade spinal canal stenosis or neural foraminal narrowing. Upper chest: No focal pulmonary opacity or pleural effusion. Other: None. IMPRESSION: No acute fracture or traumatic listhesis in the cervical spine. Mild degenerative changes without high-grade spinal canal stenosis or neural foraminal narrowing. Electronically Signed   By: Wiliam Ke M.D.   On: 07/05/2021 19:44   ? ?Procedures ?Procedures  ? ? ?Medications Ordered in ED ?Medications - No data to display ? ?ED Course/ Medical Decision Making/ A&P ?  ?                        ?Medical Decision Making ?Amount and/or Complexity of Data Reviewed ?Labs: ordered. ? ?Risk ?OTC drugs. ?Prescription drug management. ? ? ?Patient presents to the ED with complaints of neck/back/chest  pain, this involves an extensive number of treatment options, and is a complaint that carries with it a high risk of complications and morbidity. Nontoxic, vitals w/ elevated BP at times, improved on my exam and with repeat

## 2021-07-06 MED ORDER — METHOCARBAMOL 500 MG PO TABS: 500.0000 mg | ORAL_TABLET | Freq: Three times a day (TID) | ORAL | 0 refills | Status: DC | PRN

## 2021-07-06 MED ORDER — LIDOCAINE 5 % EX PTCH: 1.0000 | MEDICATED_PATCH | Freq: Every day | CUTANEOUS | 0 refills | Status: DC | PRN

## 2021-07-06 NOTE — Discharge Instructions (Signed)
You were seen in the emergency department today for neck, shoulder, and chest pain.  Your chest x-ray was normal.  Your CT of your neck showed some degenerative changes.  Your blood work showed that your creatinine which looks at kidney function was mildly elevated, please have this rechecked and followed by your primary care provider. ? ?We suspect your pain may be muscular related therefore we are sending you home with the following medicines: ?- Robaxin- this is the muscle relaxer I have prescribed, this is meant to help with muscle tightness/spasms. Be aware that this medication may make you drowsy therefore the first time you take this it should be at a time you are in an environment where you can rest. Do not drive or operate heavy machinery when taking this medication. Do not drink alcohol or take other sedating medications with this medicine such as narcotics or benzodiazepines.  ? ?- Lidoderm patch- Apply 1 patch to your area of most significant pain once per day to help numb/soothe this area. Remove & discard patch within 12 hours of application.  Do not apply heat over the patch. ? ?You make take Tylenol per over the counter dosing with these medications.  ? ?We have prescribed you new medication(s) today. Discuss the medications prescribed today with your pharmacist as they can have adverse effects and interactions with your other medicines including over the counter and prescribed medications. Seek medical evaluation if you start to experience new or abnormal symptoms after taking one of these medicines, seek care immediately if you start to experience difficulty breathing, feeling of your throat closing, facial swelling, or rash as these could be indications of a more serious allergic reaction ? ? ?Given your abnormal kidney function please avoid NSAID such as ibuprofen, Advil, Aleve, Goody powder etc. as these can further irritate the kidneys.  Please follow-up with primary care within 1 week.  Return  to the emergency department for new or worsening symptoms including but not limited to new or worsening pain, trouble breathing, passing out, coughing up blood, numbness, weakness, fever, or any other concerns. ?

## 2021-07-06 NOTE — ED Notes (Signed)
DC instructions reviewed with pT. PT verbalized understanding. PT DC ?

## 2021-12-24 ENCOUNTER — Emergency Department (HOSPITAL_BASED_OUTPATIENT_CLINIC_OR_DEPARTMENT_OTHER)
Admission: EM | Admit: 2021-12-24 | Discharge: 2021-12-24 | Disposition: A | Discharge status: Home / Self Care | Payer: Self-pay | Attending: Emergency Medicine | Admitting: Emergency Medicine

## 2021-12-24 ENCOUNTER — Encounter (HOSPITAL_BASED_OUTPATIENT_CLINIC_OR_DEPARTMENT_OTHER): Payer: Self-pay | Admitting: Emergency Medicine

## 2021-12-24 DIAGNOSIS — H538 Other visual disturbances: Secondary | ICD-10-CM | POA: Insufficient documentation

## 2021-12-24 LAB — CBG MONITORING, ED: Glucose-Capillary: 99 mg/dL (ref 70–99)

## 2021-12-24 MED ORDER — TETRACAINE HCL 0.5 % OP SOLN
2.0000 [drp] | Freq: Once | OPHTHALMIC | Status: AC
  Administered 2021-12-24: 2 [drp] via OPHTHALMIC
  Filled 2021-12-24: qty 4

## 2021-12-24 NOTE — ED Triage Notes (Signed)
Pt reports intermittent BL blurry vision "my whole life" that has become more frequent over the past 4 days. Endorses headache yesterday.

## 2021-12-24 NOTE — Discharge Instructions (Signed)
You are seen in the emergency department for blurry vision.  Your vision test was normal and your blood sugar was normal.  Your eye pressures were also normal.  It is unclear why your vision is blurry.  Please contact our eye specialist Dr. Katy Fitch on Monday for outpatient follow-up.  Return to the emergency department if any worsening or concerning symptoms

## 2021-12-24 NOTE — ED Provider Notes (Signed)
[facility] EMERGENCY DEPARTMENT Provider Note   CSN: [id number] Arrival date & time: 12/24/21  1644     History {Add pertinent medical, surgical, social history, OB history to HPI:1} Chief Complaint  Patient presents with   Eye Problem    Taylor Davidson is a 41 y.o. male.  He has no significant medical history.  He said for the last 5 years his eyes would intermittently be blurry that would last for a minute or 2.  For the last 4 days he has had more persistent blurriness.  He notices some spots in his vision at times.  No eye pain.  No trauma.  He works as a Naval architect.  He does not wear glasses or contact lenses.  No fevers or chills.  He has a family history of diabetes but no personal history.  He does not take any medications regularly  The history is provided by the patient.  Eye Problem Location:  Both eyes Quality: blurry. Severity:  Moderate Onset quality:  Gradual Duration:  4 days Timing:  Constant Progression:  Unchanged Chronicity:  New Context: not direct trauma   Relieved by:  Nothing Worsened by:  Nothing Ineffective treatments:  None tried Associated symptoms: blurred vision   Associated symptoms: no double vision, no facial rash, no headaches, no nausea, no photophobia, no redness and no vomiting        Home Medications Prior to Admission medications   Medication Sig Start Date End Date Taking? Authorizing Provider  fluticasone (FLONASE) 50 MCG/ACT nasal spray Place 2 sprays into both nostrils daily. 03/11/16   Anders Simmonds, PA-C  lidocaine (LIDODERM) 5 % Place 1 patch onto the skin daily as needed. Apply patch to area most significant pain once per day.  Remove and discard patch within 12 hours of application. 07/06/21   Petrucelli, Samantha R, PA-C  methocarbamol (ROBAXIN) 500 MG tablet Take 1 tablet (500 mg total) by mouth every 8 (eight) hours as needed for muscle spasms. 07/06/21   Petrucelli, Pleas Koch, PA-C  omeprazole (PRILOSEC) 40  MG capsule Take 1 capsule (40 mg total) by mouth daily. 11/30/18 11/30/19  Willy Eddy, MD  ondansetron (ZOFRAN ODT) 4 MG disintegrating tablet 4mg  ODT q4 hours prn nausea/vomit or dizziness 03/11/16   03/13/16, PA-C  pramoxine (PROCTOFOAM) 1 % foam Place 1 application rectally 3 (three) times daily as needed for anal itching. 04/18/20   04/20/20, MD  promethazine-dextromethorphan (PROMETHAZINE-DM) 6.25-15 MG/5ML syrup Take 5 mLs by mouth 4 (four) times daily as needed. Patient not taking: Reported on 03/11/2016 05/09/15   05/11/15, PA-C  Vitamin D, Ergocalciferol, (DRISDOL) 50000 units CAPS capsule Take 1 capsule (50,000 Units total) by mouth every 7 (seven) days. 03/14/16   03/16/16, PA-C      Allergies    Patient has no known allergies.    Review of Systems   Review of Systems  Constitutional:  Negative for fever.  Eyes:  Positive for blurred vision. Negative for double vision, photophobia and redness.  Gastrointestinal:  Negative for nausea and vomiting.  Neurological:  Negative for headaches.    Physical Exam Updated Vital Signs BP 113/81   Pulse 97   Temp 98.6 F (37 C) (Oral)   Resp 18   Ht 6\' 4"  (1.93 m)   Wt 101.2 kg   SpO2 97%   BMI 27.14 kg/m  Physical Exam Vitals and nursing note reviewed.  Constitutional:      Appearance: Normal appearance.  He is well-developed.  HENT:     Head: Normocephalic and atraumatic.  Eyes:     General: Lids are normal. Vision grossly intact. Gaze aligned appropriately.     Intraocular pressure: Right eye pressure is 16 mmHg. Left eye pressure is 17 mmHg.     Extraocular Movements: Extraocular movements intact.     Conjunctiva/sclera: Conjunctivae normal.     Right eye: Right conjunctiva is not injected. No chemosis, exudate or hemorrhage.    Left eye: Left conjunctiva is not injected. No chemosis, exudate or hemorrhage.    Pupils: Pupils are equal, round, and reactive to light.     Funduscopic exam:    Right  eye: No hemorrhage.        Left eye: No hemorrhage.     Slit lamp exam:    Right eye: Anterior chamber quiet.     Left eye: Anterior chamber quiet.  Pulmonary:     Effort: Pulmonary effort is normal.  Musculoskeletal:     Cervical back: Neck supple.  Skin:    General: Skin is warm and dry.  Neurological:     Mental Status: He is alert.     GCS: GCS eye subscore is 4. GCS verbal subscore is 5. GCS motor subscore is 6.     ED Results / Procedures / Treatments   Labs (all labs ordered are listed, but only abnormal results are displayed) Labs Reviewed  CBG MONITORING, ED    EKG None  Radiology No results found.  Procedures Procedures  {Document cardiac monitor, telemetry assessment procedure when appropriate:1}  Medications Ordered in ED Medications  tetracaine (PONTOCAINE) 0.5 % ophthalmic solution 2 drop (has no administration in time range)    ED Course/ Medical Decision Making/ A&P                           Medical Decision Making Risk Prescription drug management.   This patient complains of ***; this involves an extensive number of treatment Options and is a complaint that carries with it a high risk of complications and morbidity. The differential includes ***  I ordered, reviewed and interpreted labs, which included *** I ordered medication *** and reviewed PMP when indicated. I ordered imaging studies which included *** and I independently    visualized and interpreted imaging which showed *** Additional history obtained from *** Previous records obtained and reviewed *** I consulted *** and discussed lab and imaging findings and discussed disposition.  Cardiac monitoring reviewed, *** Social determinants considered, *** Critical Interventions: ***  After the interventions stated above, I reevaluated the patient and found *** Admission and further testing considered, ***   {Document critical care time when appropriate:1} {Document review of labs  and clinical decision tools ie heart score, Chads2Vasc2 etc:1}  {Document your independent review of radiology images, and any outside records:1} {Document your discussion with family members, caretakers, and with consultants:1} {Document social determinants of health affecting pt's care:1} {Document your decision making why or why not admission, treatments were needed:1} Final Clinical Impression(s) / ED Diagnoses Final diagnoses:  None    Rx / DC Orders ED Discharge Orders     None

## 2022-03-24 ENCOUNTER — Other Ambulatory Visit: Payer: Self-pay

## 2022-03-24 DIAGNOSIS — Z5321 Procedure and treatment not carried out due to patient leaving prior to being seen by health care provider: Secondary | ICD-10-CM | POA: Insufficient documentation

## 2022-03-24 DIAGNOSIS — R059 Cough, unspecified: Secondary | ICD-10-CM | POA: Insufficient documentation

## 2022-03-24 DIAGNOSIS — Z1152 Encounter for screening for COVID-19: Secondary | ICD-10-CM | POA: Insufficient documentation

## 2022-03-24 DIAGNOSIS — R6883 Chills (without fever): Secondary | ICD-10-CM | POA: Insufficient documentation

## 2022-03-24 DIAGNOSIS — R519 Headache, unspecified: Secondary | ICD-10-CM | POA: Insufficient documentation

## 2022-03-24 DIAGNOSIS — R0981 Nasal congestion: Secondary | ICD-10-CM | POA: Insufficient documentation

## 2022-03-24 LAB — RESP PANEL BY RT-PCR (RSV, FLU A&B, COVID)  RVPGX2
Influenza A by PCR: NEGATIVE
Influenza B by PCR: NEGATIVE
Resp Syncytial Virus by PCR: NEGATIVE
SARS Coronavirus 2 by RT PCR: NEGATIVE

## 2022-03-24 NOTE — ED Triage Notes (Signed)
Pt with cough, headache, and feeling weak for 2 days.  No fever but endorses chills.  Sinus congestion.

## 2022-03-25 ENCOUNTER — Emergency Department (HOSPITAL_BASED_OUTPATIENT_CLINIC_OR_DEPARTMENT_OTHER)
Admission: EM | Admit: 2022-03-25 | Discharge: 2022-03-25 | Discharge status: Against Medical Advice | Payer: Self-pay | Attending: Emergency Medicine | Admitting: Emergency Medicine

## 2022-03-29 ENCOUNTER — Emergency Department (HOSPITAL_BASED_OUTPATIENT_CLINIC_OR_DEPARTMENT_OTHER)
Admission: EM | Admit: 2022-03-29 | Discharge: 2022-03-29 | Disposition: A | Discharge status: Home / Self Care | Payer: Self-pay | Attending: Emergency Medicine | Admitting: Emergency Medicine

## 2022-03-29 ENCOUNTER — Encounter (HOSPITAL_BASED_OUTPATIENT_CLINIC_OR_DEPARTMENT_OTHER): Payer: Self-pay

## 2022-03-29 ENCOUNTER — Other Ambulatory Visit: Payer: Self-pay

## 2022-03-29 DIAGNOSIS — Z20822 Contact with and (suspected) exposure to covid-19: Secondary | ICD-10-CM | POA: Insufficient documentation

## 2022-03-29 DIAGNOSIS — J020 Streptococcal pharyngitis: Secondary | ICD-10-CM | POA: Insufficient documentation

## 2022-03-29 LAB — GROUP A STREP BY PCR: Group A Strep by PCR: DETECTED — AB

## 2022-03-29 LAB — RESP PANEL BY RT-PCR (RSV, FLU A&B, COVID)  RVPGX2
Influenza A by PCR: NEGATIVE
Influenza B by PCR: NEGATIVE
Resp Syncytial Virus by PCR: NEGATIVE
SARS Coronavirus 2 by RT PCR: NEGATIVE

## 2022-03-29 MED ORDER — DEXAMETHASONE SODIUM PHOSPHATE 10 MG/ML IJ SOLN
10.0000 mg | Freq: Once | INTRAMUSCULAR | Status: AC
  Administered 2022-03-29: 10 mg via INTRAMUSCULAR
  Filled 2022-03-29: qty 1

## 2022-03-29 MED ORDER — PENICILLIN G BENZATHINE 1200000 UNIT/2ML IM SUSY
1.2000 10*6.[IU] | PREFILLED_SYRINGE | Freq: Once | INTRAMUSCULAR | Status: AC
  Administered 2022-03-29: 1.2 10*6.[IU] via INTRAMUSCULAR
  Filled 2022-03-29: qty 2

## 2022-03-29 NOTE — ED Triage Notes (Signed)
Pt c/o sore throat, headache, and nasal congestion x6 days.  Pain score 7/10.  Pt reports taking OTC medication w/o relief.  Pt is concerned because he is waking up with a dry mouth.

## 2022-03-29 NOTE — ED Provider Notes (Signed)
[facility] Provider Note   CSN: [id number] Arrival date & time: 03/29/22  1011     History  Chief Complaint  Patient presents with   URI   Sore Throat   Headache    Taylor Davidson is a 42 y.o. male.  Patient with noncontributory past medical history presents today with complaints of sore throat. He states that same began initially 6 days ago and has been persistent since onset. States his son is home sick with similar symptoms. He states that he was initially running a fever but has not over the last few days.  He is able to swallow with some discomfort.  Denies neck stiffness, vision changes, chest pain, shortness of breath, nausea, or vomiting.  The history is provided by the patient. No language interpreter was used.  URI Presenting symptoms: sore throat   Sore Throat  Headache Associated symptoms: sore throat and URI        Home Medications Prior to Admission medications   Medication Sig Start Date End Date Taking? Authorizing Provider  fluticasone (FLONASE) 50 MCG/ACT nasal spray Place 2 sprays into both nostrils daily. 03/11/16   Argentina Donovan, PA-C  lidocaine (LIDODERM) 5 % Place 1 patch onto the skin daily as needed. Apply patch to area most significant pain once per day.  Remove and discard patch within 12 hours of application. 07/06/21   Petrucelli, Samantha R, PA-C  methocarbamol (ROBAXIN) 500 MG tablet Take 1 tablet (500 mg total) by mouth every 8 (eight) hours as needed for muscle spasms. 07/06/21   Petrucelli, Glynda Jaeger, PA-C  omeprazole (PRILOSEC) 40 MG capsule Take 1 capsule (40 mg total) by mouth daily. 11/30/18 11/30/19  Merlyn Lot, MD  ondansetron (ZOFRAN ODT) 4 MG disintegrating tablet 4mg  ODT q4 hours prn nausea/vomit or dizziness 03/11/16   Argentina Donovan, PA-C  pramoxine (PROCTOFOAM) 1 % foam Place 1 application rectally 3 (three) times daily as needed for anal itching. 04/18/20   Dorie Rank, MD   promethazine-dextromethorphan (PROMETHAZINE-DM) 6.25-15 MG/5ML syrup Take 5 mLs by mouth 4 (four) times daily as needed. Patient not taking: Reported on 03/11/2016 05/09/15   Domenic Moras, PA-C  Vitamin D, Ergocalciferol, (DRISDOL) 50000 units CAPS capsule Take 1 capsule (50,000 Units total) by mouth every 7 (seven) days. 03/14/16   Argentina Donovan, PA-C      Allergies    Patient has no known allergies.    Review of Systems   Review of Systems  HENT:  Positive for sore throat.   All other systems reviewed and are negative.   Physical Exam Updated Vital Signs BP 115/80 (BP Location: Left Arm)   Pulse 93   Temp 98.3 F (36.8 C) (Oral)   Resp 17   Ht 6\' 4"  (1.93 m)   Wt 97.5 kg   SpO2 97%   BMI 26.17 kg/m  Physical Exam Vitals and nursing note reviewed.  Constitutional:      General: He is not in acute distress.    Appearance: Normal appearance. He is well-developed and normal weight. He is not ill-appearing, toxic-appearing or diaphoretic.  HENT:     Head: Normocephalic and atraumatic.     Mouth/Throat:     Mouth: Mucous membranes are moist.     Pharynx: Uvula midline. No uvula swelling.     Tonsils: Tonsillar exudate present. No tonsillar abscesses. 2+ on the right. 2+ on the left.  Eyes:     Conjunctiva/sclera: Conjunctivae normal.  Pupils: Pupils are equal, round, and reactive to light.  Neck:     Comments: No meningismus Cardiovascular:     Rate and Rhythm: Normal rate and regular rhythm.     Heart sounds: Normal heart sounds.  Pulmonary:     Effort: Pulmonary effort is normal. No respiratory distress.     Breath sounds: Normal breath sounds.  Abdominal:     Palpations: Abdomen is soft.     Tenderness: There is no abdominal tenderness.  Musculoskeletal:        General: Normal range of motion.     Cervical back: Normal range of motion and neck supple.  Skin:    General: Skin is warm and dry.  Neurological:     General: No focal deficit present.     Mental  Status: He is alert.  Psychiatric:        Mood and Affect: Mood normal.        Behavior: Behavior normal.     ED Results / Procedures / Treatments   Labs (all labs ordered are listed, but only abnormal results are displayed) Labs Reviewed  GROUP A STREP BY PCR - Abnormal; Notable for the following components:      Result Value   Group A Strep by PCR DETECTED (*)    All other components within normal limits  RESP PANEL BY RT-PCR (RSV, FLU A&B, COVID)  RVPGX2    EKG None  Radiology No results found.  Procedures Procedures    Medications Ordered in ED Medications  penicillin g benzathine (BICILLIN LA) 1200000 UNIT/2ML injection 1.2 Million Units (has no administration in time range)  dexamethasone (DECADRON) injection 10 mg (has no administration in time range)    ED Course/ Medical Decision Making/ A&P                             Medical Decision Making Risk Prescription drug management.   Patient presents today with sore throat x 6 days.  He is afebrile, nontoxic-appearing, and in no acute distress with reassuring vital signs.  Physical exam reveals tonsillar exudate, cervical lymphadenopathy, & dysphagia; diagnosis of bacterial pharyngitis.  Patient's strep swab is positive.  Treated in the ED with steroids and PCN IM with improvement. Discussed importance of water rehydration. Presentation non concerning for PTA or RPA. No trismus or uvula deviation. Specific return precautions discussed. Pt able to drink water in ED without difficulty with intact air way. Recommended PCP follow up.  Patient is understanding and amenable with plan, educated on red flag symptoms that would prompt immediate return.  Patient discharged in stable condition.   Final Clinical Impression(s) / ED Diagnoses Final diagnoses:  Strep pharyngitis    Rx / DC Orders ED Discharge Orders     None     An After Visit Summary was printed and given to the patient.     Nestor Lewandowsky 03/29/22 1221    Malvin Johns, MD 03/29/22 [phone]

## 2022-03-29 NOTE — ED Notes (Signed)
Cannot discharge, registration in chart.

## 2022-03-29 NOTE — Discharge Instructions (Signed)
As we discussed, you tested positive for strep throat today.  We have treated you with antibiotics and steroids for management of this.  You should start to feel better in the next few days.  You can also take Tylenol/ibuprofen as needed for pain and fevers.  Follow-up your primary care doctor in the next few days for continued evaluation and management  Return if development of any new or worsening symptoms.

## 2022-03-29 NOTE — ED Notes (Signed)
Discharge paperwork reviewed entirely with patient, including Rx's and follow up care. Pain was under control. Pt verbalized understanding as well as all parties involved. No questions or concerns voiced at the time of discharge. No acute distress noted.   Pt ambulated out to PVA without incident or assistance.  

## 2023-01-17 ENCOUNTER — Encounter (HOSPITAL_BASED_OUTPATIENT_CLINIC_OR_DEPARTMENT_OTHER): Payer: Self-pay | Admitting: Emergency Medicine

## 2023-01-17 ENCOUNTER — Emergency Department (HOSPITAL_BASED_OUTPATIENT_CLINIC_OR_DEPARTMENT_OTHER)
Admission: EM | Admit: 2023-01-17 | Discharge: 2023-01-17 | Disposition: A | Discharge status: Home / Self Care | Payer: Medicaid Other | Attending: Emergency Medicine | Admitting: Emergency Medicine

## 2023-01-17 ENCOUNTER — Emergency Department (HOSPITAL_BASED_OUTPATIENT_CLINIC_OR_DEPARTMENT_OTHER): Payer: Medicaid Other

## 2023-01-17 ENCOUNTER — Other Ambulatory Visit: Payer: Self-pay

## 2023-01-17 DIAGNOSIS — J40 Bronchitis, not specified as acute or chronic: Secondary | ICD-10-CM | POA: Insufficient documentation

## 2023-01-17 DIAGNOSIS — R059 Cough, unspecified: Secondary | ICD-10-CM | POA: Diagnosis present

## 2023-01-17 DIAGNOSIS — Z1152 Encounter for screening for COVID-19: Secondary | ICD-10-CM | POA: Insufficient documentation

## 2023-01-17 LAB — RESP PANEL BY RT-PCR (RSV, FLU A&B, COVID)  RVPGX2
Influenza A by PCR: NEGATIVE
Influenza B by PCR: NEGATIVE
Resp Syncytial Virus by PCR: NEGATIVE
SARS Coronavirus 2 by RT PCR: NEGATIVE

## 2023-01-17 MED ORDER — PREDNISONE 20 MG PO TABS: 60.0000 mg | ORAL_TABLET | Freq: Every day | ORAL | 0 refills | Status: AC

## 2023-01-17 MED ORDER — BENZONATATE 100 MG PO CAPS: 100.0000 mg | ORAL_CAPSULE | Freq: Three times a day (TID) | ORAL | 0 refills | Status: DC

## 2023-01-17 MED ORDER — HYDROCODONE BIT-HOMATROP MBR 5-1.5 MG/5ML PO SOLN: 5.0000 mL | Freq: Four times a day (QID) | ORAL | 0 refills | Status: DC | PRN

## 2023-01-17 MED ORDER — PREDNISONE 20 MG PO TABS: 60.0000 mg | ORAL_TABLET | Freq: Every day | ORAL | 0 refills | Status: DC

## 2023-01-17 NOTE — ED Provider Notes (Signed)
[place] EMERGENCY DEPARTMENT AT [facility] Provider Note   CSN: [id number] Arrival date & time: 01/17/23  0941     History  Chief Complaint  Patient presents with   Cough   Shortness of Breath    Taylor Davidson is a 42 y.o. male.  42 year old male presents today for concern of URI symptoms ongoing for the past 4 days including significant coughing spells.  He states because of these coughing spells he is having pain to his lower right chest wall that is worse with taking a deep breath.  Denies any recent long travel, history of DVT or PE, or hemoptysis.  Denies any history of CAD.  Has not taken anything over-the-counter.  He endorses less than ideal hydration status.  No other complaints.  No significant shortness of breath.  No other chest pain.  The history is provided by the patient. No language interpreter was used.       Home Medications Prior to Admission medications   Medication Sig Start Date End Date Taking? Authorizing Provider  fluticasone (FLONASE) 50 MCG/ACT nasal spray Place 2 sprays into both nostrils daily. 03/11/16   Anders Simmonds, PA-C  lidocaine (LIDODERM) 5 % Place 1 patch onto the skin daily as needed. Apply patch to area most significant pain once per day.  Remove and discard patch within 12 hours of application. 07/06/21   Petrucelli, Samantha R, PA-C  methocarbamol (ROBAXIN) 500 MG tablet Take 1 tablet (500 mg total) by mouth every 8 (eight) hours as needed for muscle spasms. 07/06/21   Petrucelli, Pleas Koch, PA-C  omeprazole (PRILOSEC) 40 MG capsule Take 1 capsule (40 mg total) by mouth daily. 11/30/18 11/30/19  Willy Eddy, MD  ondansetron (ZOFRAN ODT) 4 MG disintegrating tablet 4mg  ODT q4 hours prn nausea/vomit or dizziness 03/11/16   Anders Simmonds, PA-C  pramoxine (PROCTOFOAM) 1 % foam Place 1 application rectally 3 (three) times daily as needed for anal itching. 04/18/20   Linwood Dibbles, MD  promethazine-dextromethorphan  (PROMETHAZINE-DM) 6.25-15 MG/5ML syrup Take 5 mLs by mouth 4 (four) times daily as needed. Patient not taking: Reported on 03/11/2016 05/09/15   Fayrene Helper, PA-C  Vitamin D, Ergocalciferol, (DRISDOL) 50000 units CAPS capsule Take 1 capsule (50,000 Units total) by mouth every 7 (seven) days. 03/14/16   Anders Simmonds, PA-C      Allergies    Patient has no known allergies.    Review of Systems   Review of Systems  Constitutional:  Negative for fever.  HENT:  Negative for sore throat.   Respiratory:  Positive for cough and shortness of breath.   Cardiovascular:  Positive for chest pain. Negative for palpitations and leg swelling.  Gastrointestinal:  Negative for abdominal pain.  Neurological:  Negative for light-headedness.  All other systems reviewed and are negative.   Physical Exam Updated Vital Signs BP 134/80   Pulse 76   Temp 98.4 F (36.9 C) (Oral)   Resp 20   Wt 102.1 kg   SpO2 98%   BMI 27.39 kg/m  Physical Exam Vitals and nursing note reviewed.  Constitutional:      General: He is not in acute distress.    Appearance: Normal appearance. He is not ill-appearing.  HENT:     Head: Normocephalic and atraumatic.     Nose: Nose normal.  Eyes:     General: No scleral icterus.    Extraocular Movements: Extraocular movements intact.     Conjunctiva/sclera: Conjunctivae normal.  Cardiovascular:  Rate and Rhythm: Normal rate and regular rhythm.     Heart sounds: Normal heart sounds.  Pulmonary:     Effort: Pulmonary effort is normal. No respiratory distress.     Breath sounds: Normal breath sounds. No wheezing or rales.  Abdominal:     General: There is no distension.     Palpations: Abdomen is soft.     Tenderness: There is no abdominal tenderness. There is no guarding.  Musculoskeletal:        General: Normal range of motion.     Cervical back: Normal range of motion.  Skin:    General: Skin is warm and dry.  Neurological:     General: No focal deficit  present.     Mental Status: He is alert. Mental status is at baseline.     ED Results / Procedures / Treatments   Labs (all labs ordered are listed, but only abnormal results are displayed) Labs Reviewed  RESP PANEL BY RT-PCR (RSV, FLU A&B, COVID)  RVPGX2    EKG None  Radiology DG Chest 2 View  Result Date: 01/17/2023 CLINICAL DATA:  Cough.  Increased shortness of breath. EXAM: CHEST - 2 VIEW COMPARISON:  07/05/2021. FINDINGS: Bilateral lung fields are clear. Bilateral costophrenic angles are clear. Normal cardio-mediastinal silhouette. No acute osseous abnormalities. The soft tissues are within normal limits. IMPRESSION: *No active cardiopulmonary disease. Electronically Signed   By: Jules Schick M.D.   On: 01/17/2023 10:20    Procedures Procedures    Medications Ordered in ED Medications - No data to display  ED Course/ Medical Decision Making/ A&P                                 Medical Decision Making Amount and/or Complexity of Data Reviewed Radiology: ordered.   42 year old male presents today for concern of URI symptoms as well as pleuritic chest pain.  No prior history of DVT, PE, or CAD.  Low heart score.  Low risk of PE on Wells criteria.  He is without tachypnea, tachycardia, or hypoxia.  Discussion had regarding additional workup for PE but patient defers.  Will treat symptomatically with Hycodan, Tessalon Perles, and prednisone.  Discussed increasing his hydration.  Return precaution discussed.  Patient voices understanding and is in agreement with plan.   Final Clinical Impression(s) / ED Diagnoses Final diagnoses:  Bronchitis    Rx / DC Orders ED Discharge Orders          Ordered    HYDROcodone bit-homatropine (HYCODAN) 5-1.5 MG/5ML syrup  Every 6 hours PRN        01/17/23 1130    benzonatate (TESSALON) 100 MG capsule  Every 8 hours        01/17/23 1130    predniSONE (DELTASONE) 20 MG tablet  Daily with breakfast        01/17/23 1130               Marita Kansas, PA-C 01/17/23 1132    Ernie Avena, MD 01/17/23 1900

## 2023-01-17 NOTE — Discharge Instructions (Signed)
We discussed treatment for bronchitis.  I have sent down, cough syrup into the pharmacy for you.  If you have any worsening symptoms return to the emergency room.  Follow-up with your PCP.

## 2023-01-17 NOTE — ED Notes (Signed)
Pharmacy changed per pt request,.   Discharge paperwork reviewed entirely with patient, including follow up care. Pain was under control. The patient received instruction and coaching on their prescriptions, and all follow-up questions were answered.  Pt verbalized understanding as well as all parties involved. No questions or concerns voiced at the time of discharge. No acute distress noted.   Pt ambulated out to PVA without incident or assistance.  Pt advised they will notify their PCP immediately.

## 2023-01-17 NOTE — ED Notes (Signed)
Pt advised he's only taken Mucinex with water, no antipyretics, has not measured his own temp. Felt overheated and nauseated, did not puke or know if he was febrile.

## 2023-01-17 NOTE — ED Triage Notes (Signed)
URI symptoms x 4 days , productive cough x 2 day , shortness of breath and pain with cough and deep breathing  ,left rib pain .

## 2023-01-26 ENCOUNTER — Other Ambulatory Visit: Payer: Self-pay

## 2023-01-26 ENCOUNTER — Emergency Department (HOSPITAL_BASED_OUTPATIENT_CLINIC_OR_DEPARTMENT_OTHER)
Admission: EM | Admit: 2023-01-26 | Discharge: 2023-01-26 | Disposition: A | Discharge status: Home / Self Care | Payer: Medicaid Other | Attending: Emergency Medicine | Admitting: Emergency Medicine

## 2023-01-26 DIAGNOSIS — H538 Other visual disturbances: Secondary | ICD-10-CM | POA: Diagnosis present

## 2023-01-26 DIAGNOSIS — H1033 Unspecified acute conjunctivitis, bilateral: Secondary | ICD-10-CM | POA: Diagnosis not present

## 2023-01-26 MED ORDER — TETRACAINE HCL 0.5 % OP SOLN
1.0000 [drp] | Freq: Once | OPHTHALMIC | Status: AC
  Administered 2023-01-26: 2 [drp] via OPHTHALMIC
  Filled 2023-01-26: qty 4

## 2023-01-26 MED ORDER — FLUORESCEIN SODIUM 1 MG OP STRP
1.0000 | ORAL_STRIP | Freq: Once | OPHTHALMIC | Status: AC
  Administered 2023-01-26: 1 via OPHTHALMIC
  Filled 2023-01-26: qty 1

## 2023-01-26 MED ORDER — POLYMYXIN B-TRIMETHOPRIM 10000-0.1 UNIT/ML-% OP SOLN: 1.0000 [drp] | OPHTHALMIC | 0 refills | Status: AC

## 2023-01-26 MED ORDER — CETIRIZINE HCL 10 MG PO TABS: 10.0000 mg | ORAL_TABLET | Freq: Every day | ORAL | 0 refills | Status: DC

## 2023-01-26 NOTE — Discharge Instructions (Signed)
Recommend you wash your hands thoroughly prior to and following touching your eyes.  Use a warm compress for 5 to 10 minutes prior to instillation of eyedrops.  Please follow-up with ophthalmology if symptoms do not start improving or worsen after 2 to 3 days  It was a pleasure caring for you today in the emergency department.  Please return to the emergency department for any worsening or worrisome symptoms.

## 2023-01-26 NOTE — ED Triage Notes (Signed)
Pt began having blurred vision yesterday and it has not resolved. Pt usually has episodes of blurred vision that last for 5 minutes and goes away but this time it has not. Last night pt put eyedrops in and when he woke up his eyes were full of " white crust" and he could barely open his eyes. Both eyes are red

## 2023-01-26 NOTE — ED Provider Notes (Signed)
[place] [facility] Provider Note  CSN: [id number] Arrival date & time: 01/26/23 7829  Chief Complaint(s) Blurred Vision  HPI Taylor Davidson is a 42 y.o. male with past medical history as below, significant for Lyme disease who presents to the ED with complaint of intermittent blurred vision, conjunctival discharge  Patient reports intermittent blurred vision for "years."  Usually goes away on its own.  He began having episode of blurred vision yesterday afternoon he felt as though his eyes are irritated and scratchy.  He applied topical rewetting drops which did not alleviate his symptoms.  He rinsed his eyes in the shower this also did not help.  Went to bed, woke up with crusting drainage from his eyes, white/yellowish.  Still having some vision abnormalities, blurry vision both eyes.  No headache, fevers chills, neck pain, hearing changes, swallowing difficulties.  Does not wear contact lenses, has not seen eye specialist for this abnormality.  Past Medical History Past Medical History:  Diagnosis Date   Lyme disease    There are no problems to display for this patient.  Home Medication(s) Prior to Admission medications   Medication Sig Start Date End Date Taking? Authorizing Provider  cetirizine (ZYRTEC ALLERGY) 10 MG tablet Take 1 tablet (10 mg total) by mouth daily for 14 days. 01/26/23 02/09/23 Yes Sloan Leiter, DO  trimethoprim-polymyxin b (POLYTRIM) ophthalmic solution Place 1 drop into both eyes every 4 (four) hours for 7 days. 01/26/23 02/02/23 Yes Sloan Leiter, DO  benzonatate (TESSALON) 100 MG capsule Take 1 capsule (100 mg total) by mouth every 8 (eight) hours. 01/17/23   Karie Mainland, Amjad, PA-C  fluticasone (FLONASE) 50 MCG/ACT nasal spray Place 2 sprays into both nostrils daily. 03/11/16   Anders Simmonds, PA-C  HYDROcodone bit-homatropine (HYCODAN) 5-1.5 MG/5ML syrup Take 5 mLs by mouth every 6 (six) hours as needed for cough. 01/17/23    Karie Mainland, Amjad, PA-C  lidocaine (LIDODERM) 5 % Place 1 patch onto the skin daily as needed. Apply patch to area most significant pain once per day.  Remove and discard patch within 12 hours of application. 07/06/21   Petrucelli, Samantha R, PA-C  methocarbamol (ROBAXIN) 500 MG tablet Take 1 tablet (500 mg total) by mouth every 8 (eight) hours as needed for muscle spasms. 07/06/21   Petrucelli, Pleas Koch, PA-C  omeprazole (PRILOSEC) 40 MG capsule Take 1 capsule (40 mg total) by mouth daily. 11/30/18 11/30/19  Willy Eddy, MD  ondansetron (ZOFRAN ODT) 4 MG disintegrating tablet 4mg  ODT q4 hours prn nausea/vomit or dizziness 03/11/16   Anders Simmonds, PA-C  pramoxine (PROCTOFOAM) 1 % foam Place 1 application rectally 3 (three) times daily as needed for anal itching. 04/18/20   Linwood Dibbles, MD  Vitamin D, Ergocalciferol, (DRISDOL) 50000 units CAPS capsule Take 1 capsule (50,000 Units total) by mouth every 7 (seven) days. 03/14/16   Anders Simmonds, PA-C  Past Surgical History Past Surgical History:  Procedure Laterality Date   HERNIA REPAIR     Family History No family history on file.  Social History Social History   Tobacco Use   Smoking status: Never   Smokeless tobacco: Never  Vaping Use   Vaping status: Never Used  Substance Use Topics   Alcohol use: No   Drug use: No   Allergies Patient has no known allergies.  Review of Systems Review of Systems  Constitutional:  Negative for chills and fever.  Eyes:  Positive for pain, discharge, redness and itching.  Respiratory:  Negative for shortness of breath.   Cardiovascular:  Negative for chest pain.  Musculoskeletal:  Negative for arthralgias.  Neurological:  Negative for light-headedness, numbness and headaches.  All other systems reviewed and are negative.   Physical Exam Vital Signs  I have  reviewed the triage vital signs BP (!) 133/90   Pulse 85   Temp (!) 97.1 F (36.2 C)   Resp 18   Ht 6\' 4"  (1.93 m)   Wt 102.7 kg   SpO2 100%   BMI 27.56 kg/m  Physical Exam Vitals and nursing note reviewed.  Constitutional:      General: He is not in acute distress.    Appearance: Normal appearance. He is well-developed. He is not ill-appearing.  HENT:     Head: Normocephalic and atraumatic.     Right Ear: External ear normal.     Left Ear: External ear normal.     Nose: Nose normal.     Mouth/Throat:     Mouth: Mucous membranes are moist.  Eyes:     General: Vision grossly intact. Gaze aligned appropriately. No visual field deficit or scleral icterus.       Right eye: Discharge present.        Left eye: Discharge present.    Extraocular Movements: Extraocular movements intact.     Right eye: Normal extraocular motion and no nystagmus.     Left eye: Normal extraocular motion and no nystagmus.     Conjunctiva/sclera:     Right eye: Right conjunctiva is injected.     Left eye: Left conjunctiva is injected.     Comments: No fluorescein uptake No Seidel sign No obvious foreign bodies  Cardiovascular:     Rate and Rhythm: Normal rate.  Pulmonary:     Effort: Pulmonary effort is normal. No respiratory distress.     Breath sounds: No stridor.  Abdominal:     General: Abdomen is flat. There is no distension.     Tenderness: There is no guarding.  Musculoskeletal:        General: No deformity.     Cervical back: No rigidity.  Skin:    General: Skin is warm and dry.     Coloration: Skin is not cyanotic, jaundiced or pale.  Neurological:     General: No focal deficit present.     Mental Status: He is alert and oriented to person, place, and time.     GCS: GCS eye subscore is 4. GCS verbal subscore is 5. GCS motor subscore is 6.     Cranial Nerves: No dysarthria or facial asymmetry.     Motor: Motor function is intact. No tremor.     Coordination: Coordination is intact.      Gait: Gait is intact.  Psychiatric:        Speech: Speech normal.        Behavior: Behavior normal. Behavior is  cooperative.     ED Results and Treatments Labs (all labs ordered are listed, but only abnormal results are displayed) Labs Reviewed - No data to display                                                                                                                        Radiology No results found.  Pertinent labs & imaging results that were available during my care of the patient were reviewed by me and considered in my medical decision making (see MDM for details).  Medications Ordered in ED Medications  tetracaine (PONTOCAINE) 0.5 % ophthalmic solution 1-2 drop (2 drops Both Eyes Given 01/26/23 0450)  fluorescein ophthalmic strip 1 strip (1 strip Both Eyes Given 01/26/23 0450)                                                                                                                                     Procedures Procedures  (including critical care time)  Medical Decision Making / ED Course    Medical Decision Making:    Abem Stpaul is a 42 y.o. male with past medical history as below, significant for Lyme disease who presents to the ED with complaint of intermittent blurred vision, conjunctival discharge. The complaint involves an extensive differential diagnosis and also carries with it a high risk of complications and morbidity.  Serious etiology was considered. Ddx includes but is not limited to: Conjunctivitis, foreign body, rhinitis, intraocular abnormality, corneal abrasion, retinal detachment, cataract, glaucoma etc.  Complete initial physical exam performed, notably the patient was in no acute distress   Reviewed and confirmed nursing documentation for past medical history, family history, social history.  Vital signs reviewed.         Brief summary:  42 year old male with recurrent intermittent blurry vision here with blurry vision,  conjunctival injection, conjunctival drainage.  Fluorescein exam was stable.  Drainage noted from bilateral conjunctiva which are injected.  Possible bilateral bacterial conjunctivitis.  Start patient on Polytrim.  Does not wear corrective lenses. Have him follow-up with ophthalmology given intermittent vision abnormalities  Similar presentation 12/24/2021; at that time he was recommended for ophthalmology follow-up but he unfortunately did not follow-up.  The patient improved significantly and was discharged in stable condition. Detailed discussions were had with the patient regarding current findings, and need for close f/u with PCP or on call doctor. The patient has been instructed  to return immediately if the symptoms worsen in any way for re-evaluation. Patient verbalized understanding and is in agreement with current care plan. All questions answered prior to discharge.                 Additional history obtained: -Additional history obtained from na -External records from outside source obtained and reviewed including: Chart review including previous notes, labs, imaging, consultation notes including  Prior ED visits, prior labs and   Lab Tests: na  EKG   EKG Interpretation Date/Time:    Ventricular Rate:    PR Interval:    QRS Duration:    QT Interval:    QTC Calculation:   R Axis:      Text Interpretation:           Imaging Studies ordered: na   Medicines ordered and prescription drug management: Meds ordered this encounter  Medications   tetracaine (PONTOCAINE) 0.5 % ophthalmic solution 1-2 drop   fluorescein ophthalmic strip 1 strip   trimethoprim-polymyxin b (POLYTRIM) ophthalmic solution    Sig: Place 1 drop into both eyes every 4 (four) hours for 7 days.    Dispense:  10 mL    Refill:  0   cetirizine (ZYRTEC ALLERGY) 10 MG tablet    Sig: Take 1 tablet (10 mg total) by mouth daily for 14 days.    Dispense:  14 tablet    Refill:  0    -I  have reviewed the patients home medicines and have made adjustments as needed   Consultations Obtained: na   Cardiac Monitoring: Continuous pulse oximetry interpreted by myself, 100% on RA.    Social Determinants of Health:  Diagnosis or treatment significantly limited by social determinants of health: no pcp   Reevaluation: After the interventions noted above, I reevaluated the patient and found that they have stayed the same  Co morbidities that complicate the patient evaluation  Past Medical History:  Diagnosis Date   Lyme disease       Dispostion: Disposition decision including need for hospitalization was considered, and patient discharged from emergency department.    Final Clinical Impression(s) / ED Diagnoses Final diagnoses:  Acute conjunctivitis of both eyes, unspecified acute conjunctivitis type        Sloan Leiter, DO 01/26/23 1610

## 2023-09-11 ENCOUNTER — Emergency Department (HOSPITAL_BASED_OUTPATIENT_CLINIC_OR_DEPARTMENT_OTHER)
Admission: EM | Admit: 2023-09-11 | Discharge: 2023-09-11 | Disposition: A | Discharge status: Home / Self Care | Attending: Emergency Medicine | Admitting: Emergency Medicine

## 2023-09-11 DIAGNOSIS — R21 Rash and other nonspecific skin eruption: Secondary | ICD-10-CM | POA: Insufficient documentation

## 2023-09-11 MED ORDER — PREDNISONE 20 MG PO TABS: 40.0000 mg | ORAL_TABLET | Freq: Every day | ORAL | 0 refills | Status: DC

## 2023-09-11 MED ORDER — PREDNISONE 20 MG PO TABS
40.0000 mg | ORAL_TABLET | Freq: Once | ORAL | Status: AC
  Administered 2023-09-11: 40 mg via ORAL
  Filled 2023-09-11: qty 2

## 2023-09-11 NOTE — ED Provider Notes (Signed)
 [place] [facility] Provider Note   CSN: [id number] Arrival date & time: 09/11/23  1725     Patient presents with: Rash   Taylor Davidson is a 43 y.o. male.   Patient presents to the emergency department for evaluation of itchy bumps.  Patient reports being in a new location about 3 days ago.  He slept on a couch that was there.  He was around a lot of other people and walking through some tall grass.  The following day patient noted some itchy bumps on his bilateral legs, bilateral arms, back.  He denies any associated facial swelling, tongue or lip swelling, difficulty breathing or swallowing, vomiting, diarrhea, lightheadedness or syncope.  He denies any new medications or foods or history of allergic reactions to these.  No fevers or other infectious symptoms.  He tried Benadryl  as well as cortisone lotion today.  This did not help, prompting emergency department visit.       Prior to Admission medications   Medication Sig Start Date End Date Taking? Authorizing Provider  predniSONE  (DELTASONE ) 20 MG tablet Take 2 tablets (40 mg total) by mouth daily. 09/11/23  Yes Desiderio Chew, PA-C  benzonatate  (TESSALON ) 100 MG capsule Take 1 capsule (100 mg total) by mouth every 8 (eight) hours. 01/17/23   Hildegard Loge, PA-C  cetirizine  (ZYRTEC  ALLERGY) 10 MG tablet Take 1 tablet (10 mg total) by mouth daily for 14 days. 01/26/23 02/09/23  Elnor Jayson LABOR, DO  fluticasone  (FLONASE ) 50 MCG/ACT nasal spray Place 2 sprays into both nostrils daily. 03/11/16   Danton Jon HERO, PA-C  HYDROcodone  bit-homatropine (HYCODAN) 5-1.5 MG/5ML syrup Take 5 mLs by mouth every 6 (six) hours as needed for cough. 01/17/23   Hildegard, Amjad, PA-C  lidocaine  (LIDODERM ) 5 % Place 1 patch onto the skin daily as needed. Apply patch to area most significant pain once per day.  Remove and discard patch within 12 hours of application. 07/06/21   Petrucelli, Samantha R, PA-C  methocarbamol   (ROBAXIN ) 500 MG tablet Take 1 tablet (500 mg total) by mouth every 8 (eight) hours as needed for muscle spasms. 07/06/21   Petrucelli, Samantha R, PA-C  omeprazole  (PRILOSEC) 40 MG capsule Take 1 capsule (40 mg total) by mouth daily. 11/30/18 11/30/19  Lang Dover, MD  ondansetron  (ZOFRAN  ODT) 4 MG disintegrating tablet 4mg  ODT q4 hours prn nausea/vomit or dizziness 03/11/16   McClung, Angela M, PA-C  pramoxine (PROCTOFOAM) 1 % foam Place 1 application rectally 3 (three) times daily as needed for anal itching. 04/18/20   Randol Simmonds, MD  Vitamin D , Ergocalciferol , (DRISDOL ) 50000 units CAPS capsule Take 1 capsule (50,000 Units total) by mouth every 7 (seven) days. 03/14/16   Danton Jon HERO, PA-C    Allergies: Patient has no known allergies.    Review of Systems  Updated Vital Signs BP 125/78 (BP Location: Left Arm)   Pulse 74   Temp 98.3 F (36.8 C)   Resp 18   Ht 6' 4 (1.93 m)   Wt 102.7 kg   SpO2 99%   BMI 27.56 kg/m   Physical Exam Vitals and nursing note reviewed.  Constitutional:      Appearance: He is well-developed.  HENT:     Head: Normocephalic and atraumatic.     Right Ear: Tympanic membrane, ear canal and external ear normal.     Left Ear: Tympanic membrane, ear canal and external ear normal.     Mouth/Throat:  Comments: No intraoral lesions or signs of angioedema. Eyes:     Conjunctiva/sclera: Conjunctivae normal.  Pulmonary:     Effort: No respiratory distress.  Musculoskeletal:     Cervical back: Normal range of motion and neck supple.  Skin:    General: Skin is warm and dry.     Comments: Patient with grouped papules noted, over the anterior lower legs, mid back, forearms.  No vesicles or drainage.  There are bilateral.  Neurological:     Mental Status: He is alert.     (all labs ordered are listed, but only abnormal results are displayed) Labs Reviewed - No data to display  EKG: None  Radiology: No results found.   Procedures    Medications Ordered in the ED  predniSONE  (DELTASONE ) tablet 40 mg (has no administration in time range)   ED Course  Patient seen and examined. History obtained directly from patient. Work-up including labs, imaging, EKG ordered in triage, if performed, were reviewed.    Labs/EKG: None ordered  Imaging: None ordered  Medications/Fluids: Ordered: Prednisone   Most recent vital signs reviewed and are as follows: BP 125/78 (BP Location: Left Arm)   Pulse 74   Temp 98.3 F (36.8 C)   Resp 18   Ht 6' 4 (1.93 m)   Wt 102.7 kg   SpO2 99%   BMI 27.56 kg/m   Initial impression: Nonspecific skin rash.  We discussed possible etiologies including bedbugs from sleeping on the couch, contact dermatitis from a plant, reaction to food or soap exposure.  This appears to be a allergic reaction and not an infectious response.  Home treatment plan: Prednisone  burst, oral antihistamines, topical medications for itching if desired  Return instructions discussed with patient: New or worsening symptoms, trouble breathing  Follow-up instructions discussed with patient: PCP as needed, return with worsening                                  Medical Decision Making Risk Prescription drug management.   Patient with nonspecific skin rash, appears allergic.  Several possible exposures.  No signs of anaphylaxis.  No infectious symptoms.  This is not shingles.  No signs of Stevens-Johnson or TEN.  Will trial oral prednisone  given large distribution of rash, not amenable to topical treatment.     Final diagnoses:  Rash and nonspecific skin eruption    ED Discharge Orders          Ordered    predniSONE  (DELTASONE ) 20 MG tablet  Daily        09/11/23 1740               Desiderio Chew, PA-C 09/11/23 1745    Jerrol Agent, MD 09/11/23 1907

## 2023-09-11 NOTE — Discharge Instructions (Signed)
 Please read and follow all provided instructions.  Your diagnoses today include:  1. Rash and nonspecific skin eruption    Tests performed today include: Vital signs. See below for your results today.   Medications prescribed:  Prednisone  - steroid medicine   It is best to take this medication in the morning to prevent sleeping problems. If you are diabetic, monitor your blood sugar closely and stop taking Prednisone  if blood sugar is over 300. Take with food to prevent stomach upset.   Please take Benadryl  at night and loratadine/Claritin during the day as this will not make you as drowsy.  You may use topical oatmeal bath or calamine lotion as well if desired for itching.  Take any prescribed medications only as directed.  Home care instructions:  Follow any educational materials contained in this packet.  BE VERY CAREFUL not to take multiple medicines containing Tylenol  (also called acetaminophen ). Doing so can lead to an overdose which can damage your liver and cause liver failure and possibly death.   Follow-up instructions: Please follow-up with your primary care provider as needed for further evaluation of your symptoms.   Return instructions:  Please return to the Emergency Department if you experience worsening symptoms.  Please return if you have any other emergent concerns.  Additional Information:  Your vital signs today were: BP 125/78 (BP Location: Left Arm)   Pulse 74   Temp 98.3 F (36.8 C)   Resp 18   Ht 6' 4 (1.93 m)   Wt 102.7 kg   SpO2 99%   BMI 27.56 kg/m  If your blood pressure (BP) was elevated above 135/85 this visit, please have this repeated by your doctor within one month. --------------

## 2023-09-11 NOTE — ED Triage Notes (Signed)
 Pt Friday noticed bumps on arms and legs  States getting worse  Putting cortisone on it with no relief  States itching

## 2024-02-01 ENCOUNTER — Emergency Department (HOSPITAL_BASED_OUTPATIENT_CLINIC_OR_DEPARTMENT_OTHER)
Admission: EM | Admit: 2024-02-01 | Discharge: 2024-02-01 | Disposition: A | Discharge status: Home / Self Care | Attending: Emergency Medicine | Admitting: Emergency Medicine

## 2024-02-01 ENCOUNTER — Encounter (HOSPITAL_BASED_OUTPATIENT_CLINIC_OR_DEPARTMENT_OTHER): Payer: Self-pay

## 2024-02-01 ENCOUNTER — Other Ambulatory Visit: Payer: Self-pay

## 2024-02-01 DIAGNOSIS — R059 Cough, unspecified: Secondary | ICD-10-CM | POA: Diagnosis present

## 2024-02-01 DIAGNOSIS — J069 Acute upper respiratory infection, unspecified: Secondary | ICD-10-CM | POA: Diagnosis not present

## 2024-02-01 LAB — RESP PANEL BY RT-PCR (RSV, FLU A&B, COVID)  RVPGX2
Influenza A by PCR: NEGATIVE
Influenza B by PCR: NEGATIVE
Resp Syncytial Virus by PCR: NEGATIVE
SARS Coronavirus 2 by RT PCR: NEGATIVE

## 2024-02-01 MED ORDER — BENZONATATE 100 MG PO CAPS: 100.0000 mg | ORAL_CAPSULE | Freq: Three times a day (TID) | ORAL | 0 refills | Status: AC

## 2024-02-01 NOTE — ED Triage Notes (Signed)
 Pt states that he is getting dizzy when he states up. States that he is getting light headed and his head is congested. States that he has been coughing x 2 days. States that head is hurting. Denies fever.

## 2024-02-01 NOTE — ED Provider Notes (Signed)
 [place] [facility] Provider Note   CSN: [id number] Arrival date & time: 02/01/24  1410     Patient presents with: flu-like symptoms   Taylor Davidson is a 43 y.o. male.   Patient with no significant past medical history presents to the emergency department today for URI symptoms.  Symptom started about 2 days ago.  Patient has had some congestion and crackling in his ear.  Temperature 100.0 F on arrival here.  He states that he has some dizziness, especially when he stands up.  He feels fatigued and tired.  He has cough.  No vomiting or diarrhea.  He reports sick contact with daughter who is in daycare.  He has been treating with over-the-counter medications.       Prior to Admission medications  Medication Sig Start Date End Date Taking? Authorizing Provider  benzonatate  (TESSALON ) 100 MG capsule Take 1 capsule (100 mg total) by mouth every 8 (eight) hours. 01/17/23   Hildegard Loge, PA-C  cetirizine  (ZYRTEC  ALLERGY) 10 MG tablet Take 1 tablet (10 mg total) by mouth daily for 14 days. 01/26/23 02/09/23  Elnor Jayson LABOR, DO  fluticasone  (FLONASE ) 50 MCG/ACT nasal spray Place 2 sprays into both nostrils daily. 03/11/16   Danton Jon HERO, PA-C  HYDROcodone  bit-homatropine (HYCODAN) 5-1.5 MG/5ML syrup Take 5 mLs by mouth every 6 (six) hours as needed for cough. 01/17/23   Hildegard, Amjad, PA-C  lidocaine  (LIDODERM ) 5 % Place 1 patch onto the skin daily as needed. Apply patch to area most significant pain once per day.  Remove and discard patch within 12 hours of application. 07/06/21   Petrucelli, Samantha R, PA-C  methocarbamol  (ROBAXIN ) 500 MG tablet Take 1 tablet (500 mg total) by mouth every 8 (eight) hours as needed for muscle spasms. 07/06/21   Petrucelli, Samantha R, PA-C  omeprazole  (PRILOSEC) 40 MG capsule Take 1 capsule (40 mg total) by mouth daily. 11/30/18 11/30/19  Lang Dover, MD  ondansetron  (ZOFRAN  ODT) 4 MG disintegrating tablet 4mg  ODT q4  hours prn nausea/vomit or dizziness 03/11/16   McClung, Angela M, PA-C  pramoxine (PROCTOFOAM) 1 % foam Place 1 application rectally 3 (three) times daily as needed for anal itching. 04/18/20   Randol Simmonds, MD  predniSONE  (DELTASONE ) 20 MG tablet Take 2 tablets (40 mg total) by mouth daily. 09/11/23   Corde Antonini, PA-C  Vitamin D , Ergocalciferol , (DRISDOL ) 50000 units CAPS capsule Take 1 capsule (50,000 Units total) by mouth every 7 (seven) days. 03/14/16   Danton Jon HERO, PA-C    Allergies: Patient has no known allergies.    Review of Systems  Updated Vital Signs BP 126/77 (BP Location: Right Arm)   Pulse 100   Temp 100 F (37.8 C) (Oral)   Resp 18   Ht 6' 4 (1.93 m)   Wt 102.1 kg   SpO2 98%   BMI 27.39 kg/m   Physical Exam Vitals and nursing note reviewed.  Constitutional:      Appearance: He is well-developed.  HENT:     Head: Normocephalic and atraumatic.     Jaw: No trismus.     Right Ear: Tympanic membrane, ear canal and external ear normal.     Left Ear: Tympanic membrane, ear canal and external ear normal.     Nose: Congestion and rhinorrhea present. No mucosal edema.     Mouth/Throat:     Mouth: Mucous membranes are not dry.     Pharynx: Uvula midline. Posterior oropharyngeal erythema  present. No oropharyngeal exudate or uvula swelling.     Tonsils: No tonsillar abscesses.  Eyes:     General:        Right eye: No discharge.        Left eye: No discharge.     Conjunctiva/sclera: Conjunctivae normal.  Cardiovascular:     Rate and Rhythm: Normal rate and regular rhythm.     Heart sounds: Normal heart sounds.  Pulmonary:     Effort: Pulmonary effort is normal. No respiratory distress.     Breath sounds: Normal breath sounds. No wheezing or rales.     Comments: No cough during exam, lungs clear to auscultation bilaterally Abdominal:     Palpations: Abdomen is soft.     Tenderness: There is no abdominal tenderness.  Musculoskeletal:     Cervical back: Normal  range of motion and neck supple.  Skin:    General: Skin is warm and dry.  Neurological:     Mental Status: He is alert.     (all labs ordered are listed, but only abnormal results are displayed) Labs Reviewed  RESP PANEL BY RT-PCR (RSV, FLU A&B, COVID)  [id number]    EKG: None  Radiology: No results found.   Procedures   Medications Ordered in the ED - No data to display  ED Course  Patient seen and examined. History obtained directly from patient.   Labs/EKG: Viral panel ordered in triage  Imaging: None ordered  Medications/Fluids: None ordered Most recent vital signs reviewed and are as follows: BP 126/77 (BP Location: Right Arm)   Pulse 100   Temp 100 F (37.8 C) (Oral)   Resp 18   Ht 6' 4 (1.93 m)   Wt 102.1 kg   SpO2 98%   BMI 27.39 kg/m   Initial impression: URI  3:38 PM Reassessment performed. Patient appears stable, comfortable.  Labs personally reviewed and interpreted including: Viral panel was negative.  Reviewed pertinent lab work and imaging with patient at bedside. Questions answered.   Most current vital signs reviewed and are as follows: BP 126/77 (BP Location: Right Arm)   Pulse 100   Temp 100 F (37.8 C) (Oral)   Resp 18   Ht 6' 4 (1.93 m)   Wt 102.1 kg   SpO2 98%   BMI 27.39 kg/m   Plan: Discharge to home.   Prescriptions written for: Tessalon   Other home care instructions discussed: Discussed importance of rest, maintaining good hydration. Also discussed use of OTC meds as desired for symptomatic treatment. Discussed typical course of generic viral illness.   ED return instructions discussed: Encouraged return with severe symptoms or significant worsening of current symptoms.  This includes high persistent fever, persistent vomiting, worsening difficulty breathing or shortness of breath, increased work of breathing, or other concerns.  Follow-up instructions discussed: Patient encouraged to follow-up with their PCP in 5 days  if not improving.                                   Medical Decision Making  Patient with symptoms consistent with a viral syndrome. Vitals are stable, no fever. No signs of dehydration. Lung exam normal, no signs of pneumonia. Supportive therapy indicated with return if symptoms worsen.        Final diagnoses:  Viral URI with cough    ED Discharge Orders     None  Desiderio Chew, PA-C 02/01/24 1539    Long, Yakub Lodes G, MD 02/01/24 [phone]

## 2024-02-01 NOTE — Discharge Instructions (Signed)
 Please read and follow all provided instructions.  Your diagnoses today include:  1. Viral URI with cough     You appear to have an upper respiratory infection (URI). An upper respiratory tract infection, or cold, is a viral infection of the air passages leading to the lungs. It should improve gradually after 5-7 days. You may have a lingering cough that lasts for 2- 4 weeks after the infection.  Tests performed today include: Vital signs. See below for your results today.  Flu, COVID, RSV testing: Was negative  Medications prescribed:  Tessalon  Perles - cough suppressant medication  Take any prescribed medications only as directed. Treatment for your infection is aimed at treating the symptoms. There are no medications, such as antibiotics, that will cure your infection.   Home care instructions:  You can take Tylenol  and/or Ibuprofen  as directed on the packaging for fever reduction and pain relief.    For cough: honey 1/2 to 1 teaspoon (you can dilute the honey in water or another fluid).  You can also use guaifenesin  and dextromethorphan for cough. You can use a humidifier for chest congestion and cough.  If you don't have a humidifier, you can sit in the bathroom with the hot shower running.      For sore throat: try warm salt water gargles, cepacol lozenges, throat spray, warm tea or water with lemon/honey, popsicles or ice, or OTC cold relief medicine for throat discomfort.    For congestion: take a daily anti-histamine like Zyrtec , Claritin, and a oral decongestant, such as pseudoephedrine.  You can also use Flonase  1-2 sprays in each nostril daily.    It is important to stay hydrated: drink plenty of fluids (water, gatorade/powerade/pedialyte, juices, or teas) to keep your throat moisturized and help further relieve irritation/discomfort.   Your illness is contagious and can be spread to others, especially during the first 3 or 4 days. It cannot be cured by antibiotics or other  medicines. Take basic precautions such as washing your hands often, covering your mouth when you cough or sneeze, and avoiding public places where you could spread your illness to others.   Please continue drinking plenty of fluids.  Use over-the-counter medicines as needed as directed on packaging for symptom relief.  You may also use ibuprofen  or tylenol  as directed on packaging for pain or fever.  Do not take multiple medicines containing Tylenol  or acetaminophen  to avoid taking too much of this medication.  Follow-up instructions: Please follow-up with your primary care provider in the next 5 days for further evaluation of your symptoms if you are not feeling better.   Return instructions:  Please return to the Emergency Department if you experience worsening symptoms.  RETURN IMMEDIATELY IF you develop shortness of breath, confusion or altered mental status, a new rash, become dizzy, faint, or poorly responsive, or are unable to be cared for at home. Please return if you have persistent vomiting and cannot keep down fluids or develop a fever that is not controlled by tylenol  or motrin .   Please return if you have any other emergent concerns.  Additional Information:  Your vital signs today were: BP 126/77 (BP Location: Right Arm)   Pulse 100   Temp 100 F (37.8 C) (Oral)   Resp 18   Ht 6' 4 (1.93 m)   Wt 102.1 kg   SpO2 98%   BMI 27.39 kg/m  If your blood pressure (BP) was elevated above 135/85 this visit, please have this repeated by your  doctor within one month. --------------
